# Patient Record
Sex: Female | Born: 1990 | Race: Black or African American | Hispanic: No | Marital: Single | Smoking: Never smoker
Health system: Southern US, Community
[De-identification: ages and names within clinical notes are randomized; demographics above are authoritative.]

## PROBLEM LIST (undated history)

## (undated) HISTORY — PX: LEEP: SHX91

---

## 2017-09-26 NOTE — L&D Delivery Note (Signed)
Called to the room for delivery. Patient delivered infant on bed. Infant was small with fused eyelids. Heart beat was present with fetal movement of limbs. Umbilical cord was cut and clamped. infnat was wrapped in a warm blanket. Patient held infant in her arms for 10 minutes. Placenta delivered intact and spontaneously. There was evidence of a old clot attached to the placenta, suggestive of possible placental abruption. Marginal cord insertion. Placenta intact.  No vaginal lacerations.  Fundus firm and below the umbilicus.   Adelene Idler MD [facility], [facility] 02/17/18 8:13 PM

## 2018-02-17 ENCOUNTER — Inpatient Hospital Stay: Payer: Medicaid Other | Admitting: Anesthesiology

## 2018-02-17 ENCOUNTER — Emergency Department: Payer: Medicaid Other

## 2018-02-17 ENCOUNTER — Encounter: Payer: Self-pay | Admitting: Emergency Medicine

## 2018-02-17 ENCOUNTER — Other Ambulatory Visit: Payer: Self-pay

## 2018-02-17 ENCOUNTER — Inpatient Hospital Stay
Admission: EM | Admit: 2018-02-17 | Discharge: 2018-02-18 | DRG: 805 | Disposition: A | Discharge status: Home / Self Care | Payer: Medicaid Other | Attending: Obstetrics and Gynecology | Admitting: Obstetrics and Gynecology

## 2018-02-17 DIAGNOSIS — Z3A19 19 weeks gestation of pregnancy: Secondary | ICD-10-CM

## 2018-02-17 DIAGNOSIS — R109 Unspecified abdominal pain: Secondary | ICD-10-CM | POA: Diagnosis present

## 2018-02-17 DIAGNOSIS — O26872 Cervical shortening, second trimester: Principal | ICD-10-CM | POA: Diagnosis present

## 2018-02-17 DIAGNOSIS — O43122 Velamentous insertion of umbilical cord, second trimester: Secondary | ICD-10-CM | POA: Diagnosis present

## 2018-02-17 DIAGNOSIS — O418X20 Other specified disorders of amniotic fluid and membranes, second trimester, not applicable or unspecified: Secondary | ICD-10-CM

## 2018-02-17 DIAGNOSIS — O039 Complete or unspecified spontaneous abortion without complication: Secondary | ICD-10-CM

## 2018-02-17 LAB — WET PREP, GENITAL
SPERM: NONE SEEN
TRICH WET PREP: NONE SEEN
Yeast Wet Prep HPF POC: NONE SEEN

## 2018-02-17 LAB — CHLAMYDIA/NGC RT PCR (ARMC ONLY)
CHLAMYDIA TR: NOT DETECTED
N GONORRHOEAE: NOT DETECTED

## 2018-02-17 LAB — LIPASE, BLOOD: LIPASE: 26 U/L (ref 11–51)

## 2018-02-17 LAB — CBC
HEMATOCRIT: 34.9 % — AB (ref 35.0–47.0)
HEMOGLOBIN: 11.8 g/dL — AB (ref 12.0–16.0)
MCH: 34 pg (ref 26.0–34.0)
MCHC: 33.7 g/dL (ref 32.0–36.0)
MCV: 101.1 fL — AB (ref 80.0–100.0)
Platelets: 285 10*3/uL (ref 150–440)
RBC: 3.45 MIL/uL — AB (ref 3.80–5.20)
RDW: 12.2 % (ref 11.5–14.5)
WBC: 19.8 10*3/uL — AB (ref 3.6–11.0)

## 2018-02-17 LAB — COMPREHENSIVE METABOLIC PANEL
ALT: 19 U/L (ref 14–54)
ANION GAP: 10 (ref 5–15)
AST: 26 U/L (ref 15–41)
Albumin: 3.5 g/dL (ref 3.5–5.0)
Alkaline Phosphatase: 72 U/L (ref 38–126)
BUN: 7 mg/dL (ref 6–20)
CHLORIDE: 104 mmol/L (ref 101–111)
CO2: 20 mmol/L — ABNORMAL LOW (ref 22–32)
Calcium: 8.9 mg/dL (ref 8.9–10.3)
Creatinine, Ser: 0.61 mg/dL (ref 0.44–1.00)
Glucose, Bld: 117 mg/dL — ABNORMAL HIGH (ref 65–99)
POTASSIUM: 3.4 mmol/L — AB (ref 3.5–5.1)
SODIUM: 134 mmol/L — AB (ref 135–145)
Total Bilirubin: 0.8 mg/dL (ref 0.3–1.2)
Total Protein: 7.2 g/dL (ref 6.5–8.1)

## 2018-02-17 LAB — URINE DRUG SCREEN, QUALITATIVE (ARMC ONLY)
AMPHETAMINES, UR SCREEN: NOT DETECTED
BENZODIAZEPINE, UR SCRN: NOT DETECTED
Barbiturates, Ur Screen: NOT DETECTED
Cannabinoid 50 Ng, Ur ~~LOC~~: POSITIVE — AB
Cocaine Metabolite,Ur ~~LOC~~: NOT DETECTED
MDMA (ECSTASY) UR SCREEN: NOT DETECTED
METHADONE SCREEN, URINE: NOT DETECTED
Opiate, Ur Screen: NOT DETECTED
PHENCYCLIDINE (PCP) UR S: NOT DETECTED
Tricyclic, Ur Screen: NOT DETECTED

## 2018-02-17 LAB — HCG, QUANTITATIVE, PREGNANCY: hCG, Beta Chain, Quant, S: 9075 m[IU]/mL — ABNORMAL HIGH (ref <5)

## 2018-02-17 MED ORDER — IBUPROFEN 600 MG PO TABS
600.0000 mg | ORAL_TABLET | Freq: Four times a day (QID) | ORAL | Status: DC | PRN
  Administered 2018-02-17: 600 mg via ORAL
  Filled 2018-02-17: qty 1

## 2018-02-17 MED ORDER — MORPHINE SULFATE (PF) 4 MG/ML IV SOLN
INTRAVENOUS | Status: AC
  Administered 2018-02-17: 4 mg via INTRAVENOUS
  Filled 2018-02-17: qty 1

## 2018-02-17 MED ORDER — IBUPROFEN 800 MG PO TABS: 800.0000 mg | ORAL_TABLET | Freq: Three times a day (TID) | ORAL | 1 refills | Status: DC | PRN

## 2018-02-17 MED ORDER — LACTATED RINGERS IV SOLN
500.0000 mL | INTRAVENOUS | Status: DC | PRN
  Administered 2018-02-17: 1000 mL via INTRAVENOUS

## 2018-02-17 MED ORDER — ONDANSETRON HCL 4 MG/2ML IJ SOLN: 4.0000 mg | Freq: Four times a day (QID) | INTRAMUSCULAR | Status: DC | PRN

## 2018-02-17 MED ORDER — LIDOCAINE HCL (PF) 1 % IJ SOLN: 30.0000 mL | INTRAMUSCULAR | Status: DC | PRN

## 2018-02-17 MED ORDER — OXYTOCIN BOLUS FROM INFUSION
500.0000 mL | Freq: Once | INTRAVENOUS | Status: AC
  Administered 2018-02-17: 500 mL via INTRAVENOUS

## 2018-02-17 MED ORDER — EPHEDRINE 5 MG/ML INJ: 10.0000 mg | INTRAVENOUS | Status: DC | PRN

## 2018-02-17 MED ORDER — BUPIVACAINE HCL (PF) 0.25 % IJ SOLN
INTRAMUSCULAR | Status: DC | PRN
  Administered 2018-02-17: 10 mL via EPIDURAL

## 2018-02-17 MED ORDER — AMMONIA AROMATIC IN INHA
RESPIRATORY_TRACT | Status: AC
  Filled 2018-02-17: qty 10

## 2018-02-17 MED ORDER — LACTATED RINGERS IV SOLN
INTRAVENOUS | Status: DC
  Administered 2018-02-17: 18:00:00 via INTRAVENOUS

## 2018-02-17 MED ORDER — SOD CITRATE-CITRIC ACID 500-334 MG/5ML PO SOLN: 30.0000 mL | ORAL | Status: DC | PRN

## 2018-02-17 MED ORDER — LACTATED RINGERS IV SOLN: 500.0000 mL | Freq: Once | INTRAVENOUS | Status: DC

## 2018-02-17 MED ORDER — HYDROMORPHONE HCL 1 MG/ML IJ SOLN
INTRAMUSCULAR | Status: AC
  Administered 2018-02-17: 1 mg via INTRAVENOUS
  Filled 2018-02-17: qty 1

## 2018-02-17 MED ORDER — LIDOCAINE-EPINEPHRINE (PF) 1.5 %-1:200000 IJ SOLN
INTRAMUSCULAR | Status: DC | PRN
  Administered 2018-02-17: 3 mL via PERINEURAL

## 2018-02-17 MED ORDER — FENTANYL 2.5 MCG/ML W/ROPIVACAINE 0.15% IN NS 100 ML EPIDURAL (ARMC)
EPIDURAL | Status: AC
  Filled 2018-02-17: qty 100

## 2018-02-17 MED ORDER — MORPHINE SULFATE (PF) 4 MG/ML IV SOLN
4.0000 mg | Freq: Once | INTRAVENOUS | Status: AC
  Administered 2018-02-17: 4 mg via INTRAVENOUS

## 2018-02-17 MED ORDER — PHENYLEPHRINE 40 MCG/ML (10ML) SYRINGE FOR IV PUSH (FOR BLOOD PRESSURE SUPPORT): 80.0000 ug | PREFILLED_SYRINGE | INTRAVENOUS | Status: DC | PRN

## 2018-02-17 MED ORDER — DIPHENHYDRAMINE HCL 50 MG/ML IJ SOLN: 12.5000 mg | INTRAMUSCULAR | Status: DC | PRN

## 2018-02-17 MED ORDER — HYDROMORPHONE HCL 1 MG/ML IJ SOLN
1.0000 mg | Freq: Once | INTRAMUSCULAR | Status: AC
  Administered 2018-02-17: 1 mg via INTRAVENOUS

## 2018-02-17 MED ORDER — SODIUM CHLORIDE 0.9 % IV BOLUS
1000.0000 mL | Freq: Once | INTRAVENOUS | Status: AC
  Administered 2018-02-17: 1000 mL via INTRAVENOUS

## 2018-02-17 MED ORDER — OXYTOCIN 10 UNIT/ML IJ SOLN
INTRAMUSCULAR | Status: AC
  Filled 2018-02-17: qty 2

## 2018-02-17 MED ORDER — OXYTOCIN 40 UNITS IN LACTATED RINGERS INFUSION - SIMPLE MED
INTRAVENOUS | Status: AC
  Administered 2018-02-17: 500 mL via INTRAVENOUS
  Filled 2018-02-17: qty 1000

## 2018-02-17 MED ORDER — OXYTOCIN 40 UNITS IN LACTATED RINGERS INFUSION - SIMPLE MED: 2.5000 [IU]/h | INTRAVENOUS | Status: DC

## 2018-02-17 MED ORDER — MISOPROSTOL 200 MCG PO TABS
ORAL_TABLET | ORAL | Status: AC
  Filled 2018-02-17: qty 4

## 2018-02-17 MED ORDER — LIDOCAINE HCL (PF) 1 % IJ SOLN
INTRAMUSCULAR | Status: DC | PRN
  Administered 2018-02-17: 3 mL

## 2018-02-17 MED ORDER — LIDOCAINE HCL (PF) 1 % IJ SOLN
INTRAMUSCULAR | Status: AC
  Filled 2018-02-17: qty 30

## 2018-02-17 MED ORDER — PROMETHAZINE HCL 25 MG/ML IJ SOLN
12.5000 mg | Freq: Once | INTRAMUSCULAR | Status: AC
  Administered 2018-02-17: 12.5 mg via INTRAVENOUS

## 2018-02-17 MED ORDER — BENZOCAINE-MENTHOL 20-0.5 % EX AERO
1.0000 "application " | INHALATION_SPRAY | Freq: Four times a day (QID) | CUTANEOUS | Status: DC | PRN
  Administered 2018-02-17: 1 via TOPICAL
  Filled 2018-02-17: qty 56

## 2018-02-17 MED ORDER — FENTANYL 2.5 MCG/ML W/ROPIVACAINE 0.15% IN NS 100 ML EPIDURAL (ARMC)
12.0000 mL/h | EPIDURAL | Status: DC
  Administered 2018-02-17: 12 mL/h via EPIDURAL

## 2018-02-17 NOTE — ED Notes (Signed)
Pt rolling around in bed stating she is in pain. EDP at bedside. PT was dozing in and out of sleep after Nausea meds given. Pt is awaiting [place] at this time.

## 2018-02-17 NOTE — Progress Notes (Signed)
Chaplain responded to an OR  for spiritual care for the patient. Upon arrival, the patient's nurses said that now was not a good time, as the patient did not want to speak with anyone. Chaplain left the on-call pager number and offered to return whenever the patient or staff desired pastoral care.

## 2018-02-17 NOTE — Discharge Instructions (Signed)
 Please accept our heartfelt condolences and call us if you need an ear to listen.  Resources: www.babycenter.com www.acog.org [email or web address] www.[email or web address] www.[email or web address] This site has an excellent pamphlet that you can download on early pregnancy loss.  Miscarriage: Women Sharing from the Heart by Marie Allen and Shelly Marks. Published by [employer]. Summarizes reactions of 100 women who experience miscarriage. Molly's [place] by Janice Cohn. Published by [employer]. Children's book on miscarriage. Miscarriage: A Man's Book by Rick Wheat. Published by [employer] at [phone]. Written to help men understand reactions of both parents to miscarriage. Miscarriage: A Shattered Dream by Sherokee Ilse and Linda Hammer Burns. I Never Held You: A book about miscarriage, healing and recovery by Ellen M. DuBois.   MISCARRIAGE OR NEONATAL LOSS.a word to parents By Gretchen Gross, MSW For those who experience a miscarriage or fetal loss, the world has turned upside down. Regardless of whether the pregnancy was planned and wanted, or unplanned and the parents were ambivalent, there are feelings of sadness, grief and a loss of equilibrium which effects both parents. Though we allow for these feelings when other deaths occur, we are remiss in acknowledging and allowing parents to grieve their pregnancy loss or miscarriage. We wrongly equate the depth of the loss to the size of the casket. With pregnancy loss or death before birth, there is little guidance or expectation of what is normal grief. Others, who minimize or misunderstand the loss, may expect that you "get back to normal" quickly. With pressures to overlook the impact of the loss, healthy grieving processes can get stunted, overlooked, or stopped all together. I hope that this handout will allow you to acknowledge and express whatever loss you feel, to share with others  your sorrow and will encourage you and others to take a moment to understand and experience the depth of your emotions at this time.  A baby represents so much. It may represent hope, immortality, fulfillment of dreams, or an outward sign of a loving relationship. At the same time, pregnancy may bring on anxiety, fear, and an increase in commitment and responsibility. Understandably, few pregnant parents experience only one or the other of these emotions. Despite the type of emotional response to the pregnancy, as the process continues, what is central to almost all pregnancies is that over time, the bond between parent and child grows. Each day brings an expansion of the world. What was once routine now becomes more important. Diet, lifestyle, connection to family, time commitments, relationships with friends, spouse, others.nothing is as it was. These are the normal changes that a pregnant woman and her partner experience. When a baby dies, nothing that made sense before makes any sense after. We are left emotionally raw. Often our bodies respond in ways that assure us that we are crazy, but we are not. We are grieving a life that we never fully knew, a relationship that was too short. A part of us has died too. Mothers and fathers become more attached to a pregnancy as each day, week and month go by. Mothers have physical contact with the pregnancy, daily reminders of the growth and change occurring. They connect by talking to their bellies, by wondering what life will be like this time next year, look for clothes, cribs, pediatricians and more. Fathers often become attached in different ways: considering buying a new car or safe car seat, working more to earn a little more money before the baby arrives, or by reconsidering the family's financial   status. However bonding occurs, it generally increases as the baby grows. For family members, co-workers and friends, the pregnancy may be less  real. They may only know of the pregnancy from a physical or emotional distance. Since many of us are so uncomfortable with death and we work hard to deny it. With a prenatal loss, the lack of contact with the unborn baby may encourage others to minimize the loss, or even suggest that it was something to feel thankful for at this time suggesting that the pain felt now would be significantly less than at any other time in relationship to this child. That is why it feels so crazy to be in such pain when others might not understand. You might feel that you are still standing at an emotional graveside and the world wonders "when you'll be ready to go back to work" or "why you still cry. It's been four months already." Be assured that it is the rest of the world that is, in this case, acting crazy. When a living child or adult dies, we have understandings, behaviors, rituals that though painful, enable us to feel cared for, comforted, acknowledged in our loss, and encourage healing. Unfortunately, too little of this is the case when there is a miscarriage or neonatal loss. There are many difficult dilemmas that you might face in the wake of the loss. Do you have a funeral? How do you tell people? Do you publish an obituary? What do you do with the remains, both physical and emotional? I encourage people to find a formalized way to acknowledge the loss of the pregnancy and to honor the relationship that did and does still exist and will for the rest of your life. It is your choice as to whether this happens in a funeral service, in a memorial service, in a letter to the child who you did not meet or in some manner that makes sense to you religiously, spiritually or emotionally. It is not silly to ask friends or family to participate nor is it improper to keep this intensely personal and private. Grief, however, needs to be a public process. We must grieve among others and with our loved ones. We do  not all need to feel the same amount of pain in the loss, but we need to act as a loving community to support those most hurt at this time. Healthy grieving involves a strengthening of the bonds with others who we love and who love us. Some people ask a close friend or relative to contact others and inform them of the loss to minimize the telling of the story. Others find relief in telling friends, family and colleagues about the loss, in that it reminds us that the pregnancy was real and that this pain is real. Where one connection is broken, another is reinforced and strengthened. Grieving in isolation can be detrimental. By inviting important others to a funeral, tree planting, or sunset service and by taking them up on an offer to help will help your healing. Some people have described aspects of grief after a pregnancy loss that are quite different from those present after the loss of an adult relative or friend. Some things that might seem "weird" or "abnormal" are indeed, quite normal following this kind of loss. G.W. Davidson described her experience in the book "Understanding: Death of the Wished-for Child." "I kept searching for something. I wasn't sure what it was. All of a sudden one day in the kitchen, I spontaneously   got out my kitchen scales and started weighing fruits and vegetables. I realized I was trying to find something that had the identical weight that the baby did.I found myself weighing my rolling pin.it happened to be the identical length and weight that the baby was." This describes the process of building memories. With miscarriage and neonatal loss, there are too few memories of the pregnancy or the baby itself. Parents may not ever be able to see or hold a baby in these cases, especially in the earlier trimesters, so saving mementos and gathering and providing information are especially important. G.W. Davidson described a parent who needs to see and feel  something that had similar dimensions as the child. This is profoundly different than the grieving that happens when a 27 year old person dies, when we have years of memories, concrete possessions, photos and history. Though it is a different aspect of grief, it is normal, expected and healthy. Your grief will not make you crazy. Also, do not be frightened if you feel this loss in a very physical manner. Your arms or chest may ache, convincing you that your heart has broken. Women have described this as a feeling of "empty arms". They also describe a similar feeling of an emptiness in their bellies, which was previously full. Some women also swear that they still feel the baby moving in their bellies. Many people report being woken to the sound of a crying baby, having vivid dreams about babies, trying to find their lost babies, or graphic dreams of injuries happening to themselves or others. Breasts may still feel as if they are filling and it may seem as if the body is unaware of the loss. These things can feel quite confusing. If you experience any of these responses, please tell someone about them. They are not a sign of insanity, they are aspects of grief. Keeping them to yourself increases the feelings of isolation and disequilibrium. Be reassured that these are normal experiences after the loss of a pregnancy. A WORD ABOUT DIFFERENCES BETWEEN MEN'S AND WOMEN'S GRIEF If most people do not fully understand the pain of a mother's grief through pregnancy loss, there is an even greater denial on society's part that the father is in pain. I have seen men in great pain and despair after the loss of a pregnancy and they say that nobody has ever asked them about their loss. People may only ask about their wife/partner's pain. To put it simply, one father, head bowed, shoulders heaving as he cried said "if one more person asks me how Jane is, I'll fly apart. Don't they know I lost a child too?  Don't they see the circles under my eyes, my face, my pain? Don't I count?" Couples often move through the grief process in different ways and at different times. Do not assume that your partner is better because they are wanting to take in a movie or go out to eat. This may just be a diversion, a need for a change of scenery, and a yearning for normalcy. Women are usually more comfortable crying and men find safety in action. For this reason, friction can develop between partners, when one wants the other to start behaving like they did before the loss or to cry along with them. Respect one another's process. Talk to your partner about the specifics. If asked, "How are you today?" many people will just say "fine". Specifically relate that you have had a hard time with the holiday,   a friends new baby, and ask your partner how they felt in that moment. Sex is very often a very difficult prospect after a miscarriage or loss. It might remind a person of the conception, raise fears of another pregnancy and future children, or seem like too much pleasure to experience when one is otherwise in pain. Many men reconnect with their partner by being sexual and feel increasingly isolated when a partner does not respond. Talk. There may be a comfortable middle ground with each other. If the general patterns of the relationship shift toward uncomfortable dynamics, such as decreased communication, blaming one another, or increased absences from the home, it is important to seek counseling with a professional. This is not an easy time, you have had a loss. Be patient with yourself. Seek help and support from loved ones, friends, professionals. The grief will evolve with time and you will not always be in such immediate or raw pain. Be assured that you may never forget your pregnancy or your baby. Healing takes time. I have compiled a list of books on grief and death that might be useful. *Anna:  A Daughters Life. Trinna Post, [employer], 1610. A fathers account of his grief at the loss of his infant daughter. *Explaining Death to Children. Freddi Che, [address], 9604. A valuable guide to help adults/parents explain death to children. *When Pregnancy Fails: Families Coping with Miscarriage, Ectopic Pregnancy, Stillbirth, and Infant Death. Jodie Echevaria and Marijean Heath, [employer], 5409. A good resource to understand pregnancy loss, including some information on support for the family. *Hour of Gold, Hour of Lead: Diaries and Letters of Gaspar Cola. [address] [place], [address]19. Cheri Fowler writes of the pain and loss in the wake of their young childs abduction. *A Child Dies: A Portrait of Family Grief. Clydene Laming and Penelope [address] [place], the [place], 1478. Dealing with death of unborn children, infants, and children of all ages. Revised by Davonna Belling. Lennox Pippins, MS November, 2008  COPING WITH A MISCARRIAGE Newman Nip, R.N., M.A.T. You have had a miscarriage. You have lost a pregnancy. This is undoubtedly a difficult and sad time for you. Your loss may be hard to believe and it is frustrating to know you are helpless to change anything. This cant possibly be happening to me. Why me? Why not someone else I promise Ill be more careful if I can only have my baby back. These are common reactions to experiencing a miscarriage. Feelings of disbelief and anger, helplessness and guilt, depression and perhaps failure, are real and understandable. You may tell yourself that the guilt you feel is irrational, unreasonable or inappropriate, but it is there. What did I do to cause this? Why wasnt I more careful? If only I had known Is this a punishment for something I did? You may find yourself recounting the days or weeks before your miscarriage, searching for clues that you feel sure you must have missed; searching for  valid reasons for how or why this happened. Having something taken away that you cherish feels shocking and unbelievable. You ask your doctor or midwife for an explanation; friends volunteer their opinions. In many cases, your questions of how or why are not satisfactorily answered. You may have some of the above thoughts or feelings and they may be present in different combinations, differing degrees, and in some confusion. They are understandable and part of the process of beginning to cope with a significant loss. With any death, it  death, it is healthy and essential to allow yourself to experience grief whether you are a man or woman. The grieving process is not something begun and completed in a day, a week or a month. It takes time, understanding, support and acceptance. In the case of a miscarriage, this process is often more difficult because the death is not publicly acknowledged. There is no funeral to arrange and attend, no outward recognition of a loss. This may tend to increase feelings of isolation and loneliness. The term miscarriage is used here instead of the more medically correct term spontaneous abortion to avoid confusion with elective abortion. Some couples feel they must keep their grief invisible and "get over this quickly." Most employers will freely give time off to attend a family funeral, but many do not understand the reasonable and justified request for time off after a miscarriage. Loss of a longed-for pregnancy, regardless of how early, is a significant bereavement. Men and women may react to the miscarriage differently. Men may feel more helpless and frustrated than their wives because they often assume a passive-observer role. Waiting and watching can be harder than actively participating, regardless of the outcome. Men do experience a miscarriage in spite of not actively participating in the physical loss. People have different ways of coping with a loss and you  need to choose what is most helpful to you. Too often "being strong" is looked upon as a healthy way of coping, when, in fact, repressing and ignoring very real feelings is not helpful and contributes to serious coping problems. Some feelings and questions that men and women express and ask are:  It hangs over me like a black cloud. Though I've tried, I honestly don't know how to work this through. How do I say goodbye?  I feel extremely guilty. I guess I really didn't want this pregnancy. I feel relieved that it's over, but so guilty because I'm relieved. This isn't right.  I'm fine. Life must go on and I'm a strong person. There's really nothing to grieve for anyway, is there?  I feel pressure not to be sad. So I tend to cover up a lot.  How do I respond to well meaning but frustrating platitudes like, "Try again soon, dear. Another pregnancy will help." I would like to be pregnant again, but I believe a pregnancy should happen at a time of strength, not sadness.  How do I respond to silence and awkward attempts to avoid the whole issue? (You may be the one that decides to bring up the topic. Friends, relatives and acquaintances often do not know how to respond, so they say nothing).  I'm fine until my period comes. It is such a start reminder of the part of me that is lost.  My husband/wife and I always enjoyed a good sex life. Since my miscarriage, I have a hard time allowing myself to feel loving and sensuous. After all if such a loving and pleasurable experience as intercourse started something that ended in such a tragedy, it's too scary to think it might happen again. Why doesn't my husband/wife understand?  I have to face reality. Should I just force myself to attend my friend's baby shower?  Everyone was so supportive and caring when I was in the hospital. That was four months ago. I can't burden others with my sadness now. Why am I still sad? I should be over this thing by  now. Knowing common facts about miscarriage probably will not blot   negative feelings - denial, anger, disappointment, sadness - but facts can hold some comfort and can help cushion the sadness and guilt. For those wanting a child, it is frustrating and intensely disappointing to experience any miscarriage. However, most pregnancies that end in the first three months are imperfect in some way and, regardless of any precaution or intervention, are incapable of surviving and growing beyond approximately 14 weeks. This fact may not ease your sense of emptiness and sadness, but it may help ease your guilt and sense of assumed responsibility. Knowing clearly that you did not cause this to happen, that you are not directly responsible of your miscarriage by what you did or did not do, can bring a sense of relief and relaxation to your anxieties or guilt. A pregnancy that ends in later months is uniquely difficult also. Your protruding abdomen, your childs movements and heartbeat, are concrete proof of your expectant parenthood. A miscarriage occurring at 6 weeks or at 6 months causes a sudden change of events and feelings. Understandably, you may feel robbed or cheated of a promise of what was to be. Its easy to think that everyone else can have children. Surely Lavenia Atlas been tricked or badly treated, you think. A fact not commonly know is that at least one in five pregnancies end in miscarriage, which indicates you are not alone. In a room of 25 couples, the odds are that 4 have experienced a miscarriage. Of these 8 people, many have shared your experience and feelings. Of course, this is not an easy topic to talk about, therefore it can appear that miscarriages rarely happen and that you are the only one coping with this loss. Realizing you are note alone can perhaps ease some of the shock and disbelief of WHY ME? Three actions will help: 1. Giver yourself permission to be sad. It is  possible to be sad without becoming chronically depressed. Set limits for yourself if you are concerned about becoming overly depressed. If the sadness begins to wash over you while at work at 2 p.m., tell yourself that you cannot deal with it then, but make an agreement with yourself that at 8 p.m. that night you will. And then do it. Many individuals report a beginning sense of control over their grief when they realize they can create appropriate and private times to be sad, alone, or with a significant other person. If you think you may be severely depressed (i.e. having difficulty getting out of bed in the morning, withdrawal from friends and relatives, insomnia, extended loss of appetite or overeating, loss of the ability to enjoy anything), this problem is probably not something you can handle yourself. It is important to seek out professional help at this time, or at any time when you are feeling particularly discouraged about your coping. 2. Find a supportive, trusted person and talk about your feelings and your questions. Build a support system, whether it be your spouse, partner, friend, relative, colleague, clergyman, support group or Pharmacist, hospital. You may still feel some pangs of sadness when attending a baby shower, celebrating a holiday, seeing other pregnant couples or passing the date of your expected delivery, but once you work through your loss, coping with your situation will become easier. 3. Also part of the process of life itself, is understanding and accepting incongruent or seemingly contradictory feelings. As one insightful women expressed, Being happy for a friend who has just had a baby (or twins!) is a genuine feeling, but also resentment,  also resentment, anger and frustration are there, all wrapped into one package;" two apparently conflicting feelings, but both can exist, be recognized, and accepted. A miscarriage is an unexpected, often unexplained, death; with any  loss you need to give yourself permission to grieve, to be angry, sad or relieved. There is no one appropriate reaction. Acknowledging your feelings that are real and understandable, though they may not be logical or rational, is part of the grieving process. Allowing the grieving process to happen is a positive way of coping and leads to health resolve and strength to look ahead. There may be times when you may not be able to put words to your feelings, but that does not need to stop you from seeking out and talking with an understanding person. Building your own support system will help you regain confidence and strength within yourself. RECOMMENDED READING The following materials are all available through [employer], [address], [place] [address]; phone [phone]. For Adults: Empty Arms by Sherokee Ilse Empty Cradle, Broken Heart by Deborah Davis Ended Beginnings by C. Panuthos & C. Romeo Miscarriage a [employer] Resource Miscarriage - A Shattered Dream by Sherokee Ilse Newborn Death a [employer] Resource Still To Be Born by Perinatal Loss When a Baby Dies by M.J. Church et al (TCF) When Hello Means Goodbye by P. Schwiebert and P. Kirk When Pregnancy Fails by S. Borg & J. Lasker For Helping Other Children How Do We Tell the Children? by Shaefer & Lyons No New Baby by Marilyn Gryte Talking About Death by Earl Grollman Where's Jess?? A [employer] Resource Other helpful materials available locally at bookstores: The Fall of Freddy the Leaf by Leo Buscaglia, PhD, [employer] (for children) When Goodbye is Forever - Learning to Live Again After the Loss of a Child by John Bramblett, [employer]   

## 2018-02-17 NOTE — ED Notes (Signed)
Pt states to OB-GYN MD that she had 2 previous pregnancy 1 is living and 1 is deceased at 6 mths 24 weeks with a c-section.

## 2018-02-17 NOTE — H&P (Signed)
History and Physical  Jenna Harvey is an 27 y.o. female.  HPI: Patient presented to the ER today complaining of severe abdominal pain which started around noon.Marland Kitchen She was taken to ultrasound, but could not tolerate the study. She was examined by the ER physician and found to have budgling membranes. She reports that she was being seen by [facility] this pregnancy. She has been followed for a short cervix and a hiistory of a preterm birth at [redacted] week gestation by a classical cesarean section. She has been receiving 17-P injections this pregnancy. She was supposed to get a cerclage next week.    OB History    Gravida  3   Para  2   Term  1   Preterm  1   AB      Living  1     SAB      TAB      Ectopic      Multiple      Live Births  2         November 24, 2014:  Hx of 1 prior classical cesarean delivery for a 24 week infant. Infant passed away at 6 months.   2008-11-24: Full term vaginal delivery   GYN History Patient denies any fibroids or ovarian cysts.  History reviewed. No pertinent past medical history.  Past Surgical History:  Procedure Laterality Date  . LEEP     unsure of the date, after 11/24/08     History reviewed. No pertinent family history.  Social History:  reports that she has never smoked. She has never used smokeless tobacco. She reports that she drank alcohol. She reports that she has current or past drug history.  Allergies: No Known Allergies  Medications: I have reviewed the patient's current medications.  Results for orders placed or performed during the hospital encounter of 02/17/18 (from the past 48 hour(s))  Lipase, blood     Status: None   Collection Time: 02/17/18  2:57 PM  Result Value Ref Range   Lipase 26 11 - 51 U/L    Comment: Performed at [facility], [address] [place], [place] [address]  Comprehensive metabolic panel     Status: Abnormal   Collection Time: 02/17/18  2:57 PM  Result Value Ref Range   Sodium 134 (L) 135 -  145 mmol/L   Potassium 3.4 (L) 3.5 - 5.1 mmol/L   Chloride 104 101 - 111 mmol/L   CO2 20 (L) 22 - 32 mmol/L   Glucose, Bld 117 (H) 65 - 99 mg/dL   BUN 7 6 - 20 mg/dL   Creatinine, Ser 0.61 0.44 - 1.00 mg/dL   Calcium 8.9 8.9 - 10.3 mg/dL   Total Protein 7.2 6.5 - 8.1 g/dL   Albumin 3.5 3.5 - 5.0 g/dL   AST 26 15 - 41 U/L   ALT 19 14 - 54 U/L   Alkaline Phosphatase 72 38 - 126 U/L   Total Bilirubin 0.8 0.3 - 1.2 mg/dL   GFR calc non Af Amer >60 >60 mL/min   GFR calc Af Amer >60 >60 mL/min    Comment: (NOTE) The eGFR has been calculated using the CKD EPI equation. This calculation has not been validated in all clinical situations. eGFR's persistently <60 mL/min signify possible Chronic Kidney Disease.    Anion gap 10 5 - 15    Comment: Performed at [facility], [address] [place], [place] [address]  CBC     Status: Abnormal  Collection Time: 02/17/18  2:57 PM  Result Value Ref Range   WBC 19.8 (H) 3.6 - 11.0 K/uL   RBC 3.45 (L) 3.80 - 5.20 MIL/uL   Hemoglobin 11.8 (L) 12.0 - 16.0 g/dL   HCT 34.9 (L) 35.0 - 47.0 %   MCV 101.1 (H) 80.0 - 100.0 fL   MCH 34.0 26.0 - 34.0 pg   MCHC 33.7 32.0 - 36.0 g/dL   RDW 12.2 11.5 - 14.5 %   Platelets 285 150 - 440 K/uL    Comment: Performed at [facility], [address] [place], [place] [address]  hCG, quantitative, pregnancy     Status: Abnormal   Collection Time: 02/17/18  2:57 PM  Result Value Ref Range   hCG, Beta Chain, Quant, S 9,075 (H) <5 mIU/mL    Comment:          GEST. AGE      CONC.  (mIU/mL)   <=1 WEEK        5 - 50     2 WEEKS       50 - 500     3 WEEKS       100 - 10,000     4 WEEKS     1,000 - 30,000     5 WEEKS     3,500 - 115,000   6-8 WEEKS     12,000 - 270,000    12 WEEKS     15,000 - 220,000        FEMALE AND NON-PREGNANT FEMALE:     LESS THAN 5 mIU/mL Performed at [facility], [address] [place], [place] [address]   Wet prep, genital     Status: Abnormal    Collection Time: 02/17/18  4:41 PM  Result Value Ref Range   Yeast Wet Prep HPF POC NONE SEEN NONE SEEN   Trich, Wet Prep NONE SEEN NONE SEEN   Clue Cells Wet Prep HPF POC PRESENT (A) NONE SEEN   WBC, Wet Prep HPF POC RARE (A) NONE SEEN    Comment: Specimen diluted due to transport tube containing more than 1 ml of saline, interpret results with caution.   Sperm NONE SEEN     Comment: Performed at [facility], [address] [place], [place] [address]    No results found.  Review of Systems  Constitutional: Negative for chills, fever, malaise/fatigue and weight loss.  HENT: Negative for congestion, hearing loss and sinus pain.   Eyes: Negative for blurred vision and double vision.  Respiratory: Negative for cough, sputum production, shortness of breath and wheezing.   Cardiovascular: Negative for chest pain, palpitations, orthopnea and leg swelling.  Gastrointestinal: Positive for abdominal pain. Negative for constipation, diarrhea, nausea and vomiting.  Genitourinary: Negative for dysuria, flank pain, frequency, hematuria and urgency.  Musculoskeletal: Negative for back pain, falls and joint pain.  Skin: Negative for itching and rash.  Neurological: Negative for dizziness and headaches.  Psychiatric/Behavioral: Negative for depression, substance abuse and suicidal ideas. The patient is not nervous/anxious.    Blood pressure 124/68, pulse (!) 102, temperature 98.2 F (36.8 C), temperature source Oral, resp. rate 16, SpO2 100 %. Physical Exam  Nursing note and vitals reviewed. Constitutional: She is oriented to person, place, and time. She appears well-developed and well-nourished.  HENT:  Head: Normocephalic and atraumatic.  Cardiovascular: Normal rate and regular rhythm.  Respiratory: Effort normal and breath sounds normal.  GI: Soft. Bowel sounds are normal.  Genitourinary:  Genitourinary Comments: Speculum exam showed membranes at the cervical os, appearance of  fetal head in membranes.  Gentle digital exam showed patient to be 3 cm dilated with membranes pass the cervical os.   Musculoskeletal: Normal range of motion.  Neurological: She is alert and oriented to person, place, and time.  Skin: Skin is warm and dry.  Psychiatric: She has a normal mood and affect. Her behavior is normal. Judgment and thought content normal.   Bedside US: Shows cephalic fetus, fetal heart rate present 160 bpm,  Femur length is 19 weeks 3 days.   Assessment/Plan: 27 yo G3P1101 at 19 weeks 3 days gestation with second trimester abortion in process 1. Will admit patient to labor and delivery for comfort and supportive care. Epidural when desired. Delivery expected.   Wash Nienhaus R Dajuan Turnley 02/17/2018, 6:04 PM

## 2018-02-17 NOTE — Anesthesia Procedure Notes (Signed)
Epidural Patient location during procedure: OB Start time: 02/17/2018 6:05 PM End time: 02/17/2018 6:11 PM  Staffing Anesthesiologist: Yves Dill, MD Performed: anesthesiologist   Preanesthetic Checklist Completed: patient identified, site marked, surgical consent, pre-op evaluation, timeout performed, IV checked, risks and benefits discussed and monitors and equipment checked  Epidural Patient position: sitting Prep: Betadine Patient monitoring: heart rate, continuous pulse ox and blood pressure Approach: midline Location: L3-L4 Injection technique: LOR air  Needle:  Needle type: Tuohy  Needle gauge: 17 G Needle length: 9 cm and 9 Catheter type: closed end flexible Catheter size: 19 Gauge Test dose: negative and 1.5% lidocaine with Epi 1:200 K  Assessment Events: blood not aspirated, injection not painful, no injection resistance, negative IV test and no paresthesia  Additional Notes Time out called.  Patient placed in sitting position.  Back prepped and draped in sterile fashion.  A skin wheal was made in the L3-L4 interspace with 1% Lidocaine plain.  A 17G Tuohy needle was advanced to the epidural space by a loss of resistance technique.  The epidural was threaded 3 cm and the TD was negative.  The patient tolerated the procedure well. The catheter was affixed to the back in sterile fashion.Reason for block:procedure for pain

## 2018-02-17 NOTE — Anesthesia Preprocedure Evaluation (Signed)
Anesthesia Evaluation  Patient identified by MRN, date of birth, ID band Patient awake    Reviewed: Allergy & Precautions, NPO status , Patient's Chart, lab work & pertinent test results  Airway Mallampati: II  TM Distance: >3 FB     Dental   Pulmonary neg pulmonary ROS,    Pulmonary exam normal        Cardiovascular negative cardio ROS Normal cardiovascular exam     Neuro/Psych negative neurological ROS  negative psych ROS   GI/Hepatic negative GI ROS, Neg liver ROS,   Endo/Other  negative endocrine ROS  Renal/GU negative Renal ROS  negative genitourinary   Musculoskeletal negative musculoskeletal ROS (+)   Abdominal Normal abdominal exam  (+)   Peds negative pediatric ROS (+)  Hematology negative hematology ROS (+)   Anesthesia Other Findings   Reproductive/Obstetrics (+) Pregnancy                             Anesthesia Physical Anesthesia Plan  ASA: II  Anesthesia Plan: Epidural   Post-op Pain Management:    Induction:   PONV Risk Score and Plan:   Airway Management Planned: Natural Airway  Additional Equipment:   Intra-op Plan:   Post-operative Plan:   Informed Consent: I have reviewed the patients History and Physical, chart, labs and discussed the procedure including the risks, benefits and alternatives for the proposed anesthesia with the patient or authorized representative who has indicated his/her understanding and acceptance.   Dental advisory given  Plan Discussed with: CRNA and Surgeon  Anesthesia Plan Comments:        Anesthesia Quick Evaluation  

## 2018-02-17 NOTE — ED Notes (Signed)
EDP placed MRI on hold until OBGYN MD arrives.

## 2018-02-17 NOTE — Progress Notes (Signed)
Patient ID: Jenna Harvey, female   DOB: 07/03/1991, 27 y.o.   MRN: [id number]  Spoke with patient in the room. She is feeling as well as expected after the birth of the infant. She would like to be discharged home this evening. She has named her infant hope Drue Second. She declined the kleinhauer- betke testing for fetal abruption, she is tired from having pokes for blood drawls. We discussed that with her next pregnancy she should have a cerclage placed at 12 weeks. We also discussed that she can receive free grief counseling through hospice. Will discharge patient home this evening.   Adelene Idler MD [facility], [facility] 02/17/18 9:39 PM

## 2018-02-17 NOTE — Discharge Summary (Signed)
OB Discharge Summary     Patient Name: Jenna Harvey DOB: 07/02/1991 MRN: [id number]  Date of admission: 02/17/2018 Delivering MD: Natale Milch, MD  Date of Delivery: 02/17/2018  Date of discharge: 02/17/2018  Admitting diagnosis: Abdominal Pain- [redacted] weeks pregnant Intrauterine pregnancy: [redacted]w[redacted]d     Secondary diagnosis: late second trimester spontaneous abortion     Discharge diagnosis: Late second trimester spontaneous abortion,                           Hospital course:  Onset of Labor With Vaginal Delivery     27 y.o. yo G3P1101 at [redacted]w[redacted]d was admitted in Active Labor on 02/17/2018. Patient had an uncomplicated labor course as follows:  Membrane Rupture Time/Date: 7:25 PM ,02/17/2018   Intrapartum Procedures: Episiotomy: None [1]                                         Lacerations:  None [1]  Patient had a delivery of a Viable infant. 02/17/2018  Information for the patient's newborn:  Vernia, Teem [[id number]]  Delivery Method: Vaginal, Spontaneous(Filed from Delivery Summary)    Pateint had an uncomplicated postpartum course.  She is ambulating, tolerating a regular diet, passing flatus, and urinating well. Patient is discharged home in stable condition on 02/17/18.                                                                  Post partum procedures:none  Complications: None  Physical exam on 02/17/2018: Vitals:   02/17/18 1915 02/17/18 1920 02/17/18 1930 02/17/18 1957  BP:    (!) 116/54  Pulse:    (!) 106  Resp:      Temp:      TempSrc:      SpO2: 100% 100% 100%   Weight:      Height:       General: alert Lochia: appropriate Uterine Fundus: firm Incision: N/A DVT Evaluation: No evidence of DVT seen on physical exam.  Labs: Lab Results  Component Value Date   WBC 19.8 (H) 02/17/2018   HGB 11.8 (L) 02/17/2018   HCT 34.9 (L) 02/17/2018   MCV 101.1 (H) 02/17/2018   PLT 285 02/17/2018   CMP Latest Ref Rng & Units 02/17/2018  Glucose 65 - 99  mg/dL 782(N)  BUN 6 - 20 mg/dL 7  Creatinine 5.62 - 1.30 mg/dL 8.65  Sodium 784 - 696 mmol/L 134(L)  Potassium 3.5 - 5.1 mmol/L 3.4(L)  Chloride 101 - 111 mmol/L 104  CO2 22 - 32 mmol/L 20(L)  Calcium 8.9 - 10.3 mg/dL 8.9  Total Protein 6.5 - 8.1 g/dL 7.2  Total Bilirubin 0.3 - 1.2 mg/dL 0.8  Alkaline Phos 38 - 126 U/L 72  AST 15 - 41 U/L 26  ALT 14 - 54 U/L 19    Discharge instruction: per After Visit Summary.  Medications:  Allergies as of 02/17/2018   No Known Allergies     Medication List    TAKE these medications   ibuprofen 800 MG tablet Commonly known as:  ADVIL,MOTRIN Take 1 tablet (800 mg total) by mouth every 8 (eight)  hours as needed for cramping.   multivitamin-prenatal 27-0.8 MG Tabs tablet Take 1 tablet by mouth daily at 12 noon.            Discharge Care Instructions  (From admission, onward)        Start     Ordered   02/17/18 0000  Discharge wound care:    Comments:  SHOWER DAILY Wash incision gently with soap and water.  Call office with any drainage, redness, or firmness of the incision.   02/17/18 2154      Diet: routine diet  Activity: Advance as tolerated. Pelvic rest for 6 weeks.   Outpatient follow up:     Postpartum contraception: Not Discussed Rhogam Given postpartum: no Rubella vaccine given postpartum: no Varicella vaccine given postpartum: no TDaP given antepartum or postpartum: No  Newborn Data: Live born child  Birth Weight: 10.6 oz (300 g) APGAR: ,   Newborn Delivery   Birth date/time:  02/17/2018 19:25:00 Delivery type:  Vaginal, Spontaneous      Disposition:morgue  SIGNED:  Natale Milch, MD 02/17/2018 9:54 PM

## 2018-02-17 NOTE — ED Provider Notes (Signed)
[facility] Emergency Department Provider Note  Time seen: 3:00 PM  I have reviewed the triage vital signs and the nursing notes.   HISTORY  Chief Complaint Abdominal Pain    HPI Jenna Harvey is a 27 y.o. female G3, P2 [redacted] weeks pregnant who presents to the emergency department for abdominal pain nausea vomiting.  According to the patient for the past 2 hours she has been expensing lower abdominal pain as well as nausea vomiting.  Patient states she had nausea vomiting earlier in the pregnancy but has not had any for several weeks.  Patient states she took Phenergan earlier on in her pregnancy as well, but has not taken any recently.  Patient states moderate abdominal pain across her entire abdomen but worse on the lower abdomen.  Denies any dysuria, hematuria, vaginal fluid discharge or bleeding.  Patient states she was feeling well up until several hours ago.   History reviewed. No pertinent past medical history.  There are no active problems to display for this patient.   History reviewed. No pertinent surgical history.  Prior to Admission medications   Not on File    No Known Allergies  No family history on file.  Social History Social History   Tobacco Use  . Smoking status: Never Smoker  . Smokeless tobacco: Never Used  Substance Use Topics  . Alcohol use: Not Currently  . Drug use: Not Currently    Review of Systems Constitutional: Negative for fever. Eyes: Negative for visual complaints ENT: Negative for recent illness/congestion Cardiovascular: Negative for chest pain. Respiratory: Negative for shortness of breath. Gastrointestinal: Positive for moderate lower abdominal pain, aching.  Positive for nausea vomiting.  Negative for diarrhea. Genitourinary: Negative for urinary complaints. Musculoskeletal: Negative for musculoskeletal complaints Skin: Negative for skin complaints  Neurological: Negative for headache All other ROS  negative  ____________________________________________   PHYSICAL EXAM:  VITAL SIGNS: ED Triage Vitals [02/17/18 1448]  Enc Vitals Group     BP (!) 112/49     Pulse Rate (!) 103     Resp 16     Temp 98.4 F (36.9 C)     Temp Source Oral     SpO2 100 %     Weight      Height      Head Circumference      Peak Flow      Pain Score 10     Pain Loc      Pain Edu?      Excl. in GC?     Constitutional: Alert and oriented.  No distress writhing around on the bed holding her lower abdomen, active vomiting. Eyes: Normal exam ENT   Head: Normocephalic and atraumatic.   Mouth/Throat: Mucous membranes are moist. Cardiovascular: Normal rate, regular rhythm. No murmur Respiratory: Normal respiratory effort without tachypnea nor retractions. Breath sounds are clear  Gastrointestinal: Soft, mild diffuse tenderness palpation, somewhat more so in the suprapubic region.  No rebound or guarding.  No distention. Musculoskeletal: Nontender with normal range of motion in all extremities.  Neurologic:  Normal speech and language. No gross focal neurologic deficits  Skin:  Skin is warm, dry and intact.  Psychiatric: Mood and affect are normal.     INITIAL IMPRESSION / ASSESSMENT AND PLAN / ED COURSE  Pertinent labs & imaging results that were available during my care of the patient were reviewed by me and considered in my medical decision making (see chart for details).  Patient presents to the  emergency department for nausea vomiting lower abdominal pain beginning approximately 1 to 2 hours ago.  Patient actively vomiting in the emergency department.  Differential is quite broad but would include gastroenteritis, gastritis, ulcerative disease, pancreatitis, urinary tract infection, gallbladder disease, pregnancy related issue.  We will check labs, IV hydrate, treat with IV Phenergan.  We will obtain an OB ultrasound to further evaluate.  Patient agreeable to this plan of care.  She  continues to be nauseated continues to complain of significant lower abdominal pain.  Attempted to have the ultrasound performed but the patient cannot hold still for the ultrasound, ultrasound tech had to abort the ultrasound attempt.  I discussed with the patient it is extremely important that we get an ultrasound done, will also need to perform a pelvic examination.  I discussed the risk and benefits of IV pain medication.  However as the patient states severe pain in the lower abdomen and cannot tolerate any procedure with this current amount of pain we will dose 4 mg of IV morphine, and continue to closely monitor.  Once the patient's pain is controlled I plan I will perform a pelvic examination and having the patient return to ultrasound for evaluation.   After pain medication patient is able to allow more adequate abdominal examination.  Patient has a completely benign abdomen besides her right lower quadrant with moderate tenderness to palpation.  Unfortunately given her pregnancy status with moderate lower quadrant tenderness and elevated white blood cell count to 19,000 and believe the patient will require an MRI to help rule out and evaluate for appendicitis.  Pelvic examination shows what appears to be amniotic sac extending through the vaginal cervix.  We will discuss with OB/GYN urgently.  OB has seen the patient, believe the patient is unfortunately suffering preterm labor/second trimester miscarriage.  I will cancel the MRI and ultrasound at this time.  Patient taken upstairs by OB for admission.   ____________________________________________   FINAL CLINICAL IMPRESSION(S) / ED DIAGNOSES  Abdominal pain Nausea vomiting    Minna Antis, MD 02/17/18 2024

## 2018-02-17 NOTE — ED Triage Notes (Signed)
Pt to ED via POV c/o severe abdominal pain and vomiting that started 1 hour PTA. Pt states that she is [redacted] weeks pregnant, pt is G3P2, no pregnancy complications. Pt states that she sees [facility] [facility] for her OB care.

## 2018-02-18 LAB — ABO/RH: ABO/RH(D): B POS

## 2018-02-18 NOTE — OB Triage Note (Signed)
Postpartum discharge instructions and teaching provided to pt per RN and Dr Jerene Pitch. Pt ambulating oob, walking without difficulty to restroom for void and performing pericare with minimal assistance. RN remain present. Explained signs and symptoms to monitor for and report if noticed. Discussed verbalizing feeling and seeking help and support from medical staff and family as needed. Pt agrees to continue monitoring vaginal bleeding and will return to hospital with any concerns. Advised pt to follow-up post delivery with OB provider next Wed as scheduled. Pt states her wishes , would like hospital cremation and then ashes returned to her. Forms and consents completed. Understanding with plan verbalized. Pt denies any needs or concerns with going home.

## 2018-02-18 NOTE — Progress Notes (Signed)
2130 - Dr Jerene Pitch at bedside speaking with pt and discussing pt request to be discharged home tonight as soon and she can possibly leave. Spoke with Dr Noralyn Pick about pt having Epidural and still some numbness in legs. Explained pt should remain for at least 3 hrs from time of delivery and monitors pt ambulating to ensure she is not weak and can ambulate without support. Pt aware of need for monitoring for proper ambulation and for postpartum bleeding. Says she would like to go home around 2300. Dr Jerene Pitch providing pt with okay for discharge, will provide pt with out of work not and will send rx for Ibuprofen to pharmacy of pt choice on file.

## 2018-02-18 NOTE — Anesthesia Postprocedure Evaluation (Signed)
Anesthesia Post Note  Patient: Jenna Harvey  Procedure(s) Performed: AN AD HOC LABOR EPIDURAL  Patient location during evaluation: L&D Anesthesia Type: Epidural Level of consciousness: awake and alert and oriented Pain management: pain level controlled Vital Signs Assessment: post-procedure vital signs reviewed and stable Respiratory status: spontaneous breathing Cardiovascular status: blood pressure returned to baseline Postop Assessment: no headache and no backache Anesthetic complications: no     Last Vitals: There were no vitals filed for this visit.  Last Pain: There were no vitals filed for this visit.               Keni Elison

## 2018-02-21 LAB — SURGICAL PATHOLOGY

## 2019-08-29 ENCOUNTER — Emergency Department
Admission: EM | Admit: 2019-08-29 | Discharge: 2019-08-30 | Disposition: A | Discharge status: Home / Self Care | Payer: Medicaid Other | Attending: Emergency Medicine | Admitting: Emergency Medicine

## 2019-08-29 ENCOUNTER — Emergency Department: Payer: Medicaid Other

## 2019-08-29 ENCOUNTER — Other Ambulatory Visit: Payer: Self-pay

## 2019-08-29 DIAGNOSIS — Z3A00 Weeks of gestation of pregnancy not specified: Secondary | ICD-10-CM | POA: Insufficient documentation

## 2019-08-29 DIAGNOSIS — O210 Mild hyperemesis gravidarum: Secondary | ICD-10-CM | POA: Insufficient documentation

## 2019-08-29 DIAGNOSIS — Z3491 Encounter for supervision of normal pregnancy, unspecified, first trimester: Secondary | ICD-10-CM

## 2019-08-29 DIAGNOSIS — R112 Nausea with vomiting, unspecified: Secondary | ICD-10-CM

## 2019-08-29 DIAGNOSIS — O3680X0 Pregnancy with inconclusive fetal viability, not applicable or unspecified: Secondary | ICD-10-CM

## 2019-08-29 DIAGNOSIS — O219 Vomiting of pregnancy, unspecified: Secondary | ICD-10-CM | POA: Diagnosis present

## 2019-08-29 LAB — LIPASE, BLOOD: Lipase: 31 U/L (ref 11–51)

## 2019-08-29 LAB — COMPREHENSIVE METABOLIC PANEL
ALT: 18 U/L (ref 0–44)
AST: 23 U/L (ref 15–41)
Albumin: 4.7 g/dL (ref 3.5–5.0)
Alkaline Phosphatase: 70 U/L (ref 38–126)
Anion gap: 12 (ref 5–15)
BUN: 14 mg/dL (ref 6–20)
CO2: 18 mmol/L — ABNORMAL LOW (ref 22–32)
Calcium: 9.6 mg/dL (ref 8.9–10.3)
Chloride: 106 mmol/L (ref 98–111)
Creatinine, Ser: 0.89 mg/dL (ref 0.44–1.00)
GFR calc Af Amer: >60 mL/min (ref >60)
GFR calc non Af Amer: >60 mL/min (ref >60)
Glucose, Bld: 161 mg/dL — ABNORMAL HIGH (ref 70–99)
Potassium: 4.1 mmol/L (ref 3.5–5.1)
Sodium: 136 mmol/L (ref 135–145)
Total Bilirubin: 0.6 mg/dL (ref 0.3–1.2)
Total Protein: 8.2 g/dL — ABNORMAL HIGH (ref 6.5–8.1)

## 2019-08-29 LAB — HCG, QUANTITATIVE, PREGNANCY: hCG, Beta Chain, Quant, S: 186 m[IU]/mL — ABNORMAL HIGH (ref <5)

## 2019-08-29 LAB — URINALYSIS, COMPLETE (UACMP) WITH MICROSCOPIC
Bilirubin Urine: NEGATIVE
Glucose, UA: NEGATIVE mg/dL
Hgb urine dipstick: NEGATIVE
Ketones, ur: 80 mg/dL — AB
Leukocytes,Ua: NEGATIVE
Nitrite: NEGATIVE
Protein, ur: 100 mg/dL — AB
Specific Gravity, Urine: 1.03 (ref 1.005–1.030)
pH: 6 (ref 5.0–8.0)

## 2019-08-29 LAB — CBC
HCT: 38.1 % (ref 36.0–46.0)
Hemoglobin: 13.9 g/dL (ref 12.0–15.0)
MCH: 33.8 pg (ref 26.0–34.0)
MCHC: 36.5 g/dL — ABNORMAL HIGH (ref 30.0–36.0)
MCV: 92.7 fL (ref 80.0–100.0)
Platelets: 339 10*3/uL (ref 150–400)
RBC: 4.11 MIL/uL (ref 3.87–5.11)
RDW: 10.9 % — ABNORMAL LOW (ref 11.5–15.5)
WBC: 14.9 10*3/uL — ABNORMAL HIGH (ref 4.0–10.5)
nRBC: 0 % (ref 0.0–0.2)

## 2019-08-29 LAB — POCT PREGNANCY, URINE: Preg Test, Ur: POSITIVE — AB

## 2019-08-29 MED ORDER — DICYCLOMINE HCL 10 MG PO CAPS
10.0000 mg | ORAL_CAPSULE | Freq: Once | ORAL | Status: AC
  Administered 2019-08-29: 10 mg via ORAL
  Filled 2019-08-29: qty 1

## 2019-08-29 MED ORDER — ONDANSETRON HCL 4 MG/2ML IJ SOLN
4.0000 mg | Freq: Once | INTRAMUSCULAR | Status: AC
  Administered 2019-08-29: 4 mg via INTRAVENOUS
  Filled 2019-08-29: qty 2

## 2019-08-29 MED ORDER — SODIUM CHLORIDE 0.9 % IV BOLUS
1000.0000 mL | Freq: Once | INTRAVENOUS | Status: AC
  Administered 2019-08-29: 1000 mL via INTRAVENOUS

## 2019-08-29 MED ORDER — FAMOTIDINE IN NACL 20-0.9 MG/50ML-% IV SOLN
20.0000 mg | Freq: Once | INTRAVENOUS | Status: AC
  Administered 2019-08-29: 20 mg via INTRAVENOUS
  Filled 2019-08-29: qty 50

## 2019-08-29 MED ORDER — METOCLOPRAMIDE HCL 5 MG/ML IJ SOLN
10.0000 mg | Freq: Once | INTRAMUSCULAR | Status: AC
  Administered 2019-08-29 – 2019-08-30 (×2): 10 mg via INTRAVENOUS
  Filled 2019-08-29: qty 2

## 2019-08-29 NOTE — ED Notes (Signed)
Pt in ultrasound

## 2019-08-29 NOTE — ED Triage Notes (Addendum)
Pt in with co generalized abd pain that started at 1600 today with multiple episodes of vomiting. No diarrhea, or dysuria. Denies any fever. Pt did take zofran pta without any relief.

## 2019-08-29 NOTE — ED Provider Notes (Signed)
[facility] Emergency Department Provider Note ____________________________________________   First MD Initiated Contact with Patient 08/29/19 2116     (approximate)  I have reviewed the triage vital signs and the nursing notes.   HISTORY  Chief Complaint Abdominal Pain    HPI Jenna Harvey is a 28 y.o. female with PMH as noted below, G5P2 with LMP of 11/8 who presents with acute onset of vomiting this afternoon around 4 PM, multiple episodes since then, and associated with diffuse abdominal pain.  The patient states that she saw a few streaks of blood during one of the episodes of vomiting.  She denies any diarrhea, vaginal bleeding, or urinary symptoms.  She did not know she was pregnant.  She denies eating anything unusual and has not traveled.  No past medical history on file.  Patient Active Problem List   Diagnosis Date Noted  . Preterm delivery 02/17/2018    Past Surgical History:  Procedure Laterality Date  . CESAREAN SECTION    . LEEP     unsure of the date, after 2010    Prior to Admission medications   Not on File    Allergies Penicillins  No family history on file.  Social History Social History   Tobacco Use  . Smoking status: Never Smoker  . Smokeless tobacco: Never Used  Substance Use Topics  . Alcohol use: Not Currently  . Drug use: Not Currently    Types: Marijuana    Review of Systems  Constitutional: No fever. Eyes: No redness. ENT: No sore throat. Cardiovascular: Denies chest pain. Respiratory: Denies shortness of breath. Gastrointestinal: Positive for nausea vomiting.  No diarrhea. Genitourinary: Negative for dysuria.  Musculoskeletal: Negative for back pain. Skin: Negative for rash. Neurological: Negative for headache.   ____________________________________________   PHYSICAL EXAM:  VITAL SIGNS: ED Triage Vitals [08/29/19 1918]  Enc Vitals Group     BP 136/88     Pulse Rate 100     Resp 20      Temp 98.8 F (37.1 C)     Temp Source Oral     SpO2 100 %     Weight 174 lb (78.9 kg)     Height 5' (1.524 m)     Head Circumference      Peak Flow      Pain Score 10     Pain Loc      Pain Edu?      Excl. in [place]?     Constitutional: Alert and oriented.  Uncomfortable appearing but in no acute distress. Eyes: Conjunctivae are normal.  No scleral icterus. Head: Atraumatic. Nose: No congestion/rhinnorhea. Mouth/Throat: Mucous membranes are slightly dry.   Neck: Normal range of motion.  Cardiovascular:  Good peripheral circulation. Respiratory: Normal respiratory effort.  No retractions.  Gastrointestinal: Soft with mild diffuse discomfort to palpation but no focal tenderness. No distention.  Genitourinary: No flank tenderness. Musculoskeletal: Extremities warm and well perfused.  Neurologic:  Normal speech and language. No gross focal neurologic deficits are appreciated.  Skin:  Skin is warm and dry. No rash noted. Psychiatric: Mood and affect are normal. Speech and behavior are normal.  ____________________________________________   LABS (all labs ordered are listed, but only abnormal results are displayed)  Labs Reviewed  CBC - Abnormal; Notable for the following components:      Result Value   WBC 14.9 (*)    MCHC 36.5 (*)    RDW 10.9 (*)    All other components within  normal limits  COMPREHENSIVE METABOLIC PANEL - Abnormal; Notable for the following components:   CO2 18 (*)    Glucose, Bld 161 (*)    Total Protein 8.2 (*)    All other components within normal limits  URINALYSIS, COMPLETE (UACMP) WITH MICROSCOPIC - Abnormal; Notable for the following components:   Color, Urine YELLOW (*)    APPearance HAZY (*)    Ketones, ur 80 (*)    Protein, ur 100 (*)    Bacteria, UA RARE (*)    All other components within normal limits  HCG, QUANTITATIVE, PREGNANCY - Abnormal; Notable for the following components:   hCG, Beta Chain, Quant, S 186 (*)    All other  components within normal limits  POCT PREGNANCY, URINE - Abnormal; Notable for the following components:   Preg Test, Ur POSITIVE (*)    All other components within normal limits  LIPASE, BLOOD  POC URINE PREG, ED   ____________________________________________  EKG   ____________________________________________  RADIOLOGY  US pelvis transvaginal: Pending  ____________________________________________   PROCEDURES  Procedure(s) performed: No  Procedures  Critical Care performed: No ____________________________________________   INITIAL IMPRESSION / ASSESSMENT AND PLAN / ED COURSE  Pertinent labs & imaging results that were available during my care of the patient were reviewed by me and considered in my medical decision making (see chart for details).  28 year old female G5P2 with LMP of 11/8 presents with acute onset of nausea and vomiting with diffuse abdominal pain at 4 PM this afternoon.  She has had persistent vomiting since that time.  She denies any diarrhea, fever, urinary symptoms, or vaginal bleeding.  On exam, the patient is uncomfortable appearing but in no acute distress.  She is actively vomiting.  She reports that there were some streaks of blood in her vomitus previously, but she is not having any gross hematemesis at this time.  I witnessed a few episodes of vomiting, and it was yellow in color and nonbilious.  The abdomen is soft with mild diffuse discomfort but no focal tenderness.  Her vital signs are normal.  The remainder of the exam is unremarkable.  Overall I suspect most likely acute gastroenteritis versus hyperemesis gravidarum.  Given the presence of abdominal pain, ectopic pregnancy is also on the differential although unlikely.  We will obtain an ultrasound to evaluate for the location of the pregnancy although I suspect that the patient is too early to identify an IUP.  Given the lack of focal tenderness, I do not suspect cholecystitis or other  hepatobiliary cause, acute appendicitis, or other acute intra-abdominal etiology.    We will give fluids, antiemetic, obtain lab work-up, and reassess.  ----------------------------------------- 11:11 PM on 08/29/2019 -----------------------------------------  The lab work-up is unremarkable except for low bicarb consistent with vomiting.  hCG is 186.  Urinalysis shows RBCs but no evidence of UTI.  The WBC count is elevated.  Patient is pending ultrasound.  I have signed her out to the oncoming physician Dr. Manson Passey.  ____________________________________________   FINAL CLINICAL IMPRESSION(S) / ED DIAGNOSES  Final diagnoses:  Pregnancy of unknown anatomic location  Nausea and vomiting, intractability of vomiting not specified, unspecified vomiting type      NEW MEDICATIONS STARTED DURING THIS VISIT:  New Prescriptions   No medications on file     Note:  This document was prepared using Dragon voice recognition software and may include unintentional dictation errors.    Dionne Bucy, MD 08/29/19 2312

## 2019-08-30 MED ORDER — METOCLOPRAMIDE HCL 10 MG PO TABS: 10.0000 mg | ORAL_TABLET | Freq: Three times a day (TID) | ORAL | 0 refills | Status: DC

## 2019-08-30 MED ORDER — METOCLOPRAMIDE HCL 10 MG PO TABS: 20.0000 mg | ORAL_TABLET | Freq: Once | ORAL | Status: DC

## 2019-08-30 NOTE — ED Notes (Signed)
Pt feels like her nausea has improved since coming to the ED. EDP made aware.

## 2019-08-30 NOTE — ED Provider Notes (Signed)
Assumed care of the patient from Dr. Cherylann Banas.  Patient states ED quantitative 186 ultrasound revealed CLINICAL DATA: 28 year old female quantitative beta HCG 186,  estimated gestational age by LMP 3 weeks 4 days.   EXAM:  OBSTETRIC <14 WK [place] AND TRANSVAGINAL OB US   TECHNIQUE:  Both transabdominal and transvaginal ultrasound examinations were  performed for complete evaluation of the gestation as well as the  maternal uterus, adnexal regions, and pelvic cul-de-sac.  Transvaginal technique was performed to assess early pregnancy.   COMPARISON: None.   FINDINGS:  Intrauterine gestational sac: None   Maternal uterus/adnexae: Endometrial thickening, up to 15  millimeters. Bland appearance of the endometrium.   Trace or small volume simple appearing free fluid in the cul-de-sac  (series 2, image 12).   The right ovary measures 4.2 x 2.3 by 2.0 centimeters (10 millimeter  liter volume) with around thick-walled 24 millimeter area which  appears relatively hypovascular. See series 2, images 30 and 32).  The remaining right ovarian parenchyma appears normal.   Normal left ovary measuring 3.1 x 1.6 by 2.0 centimeters (volume 5  milliliters).   IMPRESSION:  1. No IUP identified. Thickened but bland endometrium.  Right ovary 24 mm thick walled complex area which is indeterminate  for corpus luteum.  Normal left ovary. Trace simple appearing pelvic free fluid.  2. Differential considerations include early IUP, failed IUP, and  ectopic pregnancy.  Recommend serial quantitative beta HCG and repeat ultrasound as  necessary.    Electronically Signed  By: Genevie Ann M.D.  On: 08/29/2019 23:32    Given last menstrual period date suspect ultrasound findings to be secondary to very early IUP however ectopic pregnancy not excluded.  Spoke with the patient at length regarding the necessity of following up with OB/GYN.  IV access was obtained patient given Reglan and IV normal saline 1  L.  Patient states that nausea is improved at this time.  Patient will be prescribed Reglan for home.   Gregor Hams, MD 08/30/19 [phone]

## 2019-12-22 ENCOUNTER — Inpatient Hospital Stay
Admission: EM | Admit: 2019-12-22 | Discharge: 2019-12-23 | DRG: 833 | Disposition: A | Discharge status: Other Acute Inpatient Hospital | Payer: Medicaid Other | Attending: Obstetrics & Gynecology | Admitting: Obstetrics & Gynecology

## 2019-12-22 ENCOUNTER — Other Ambulatory Visit: Payer: Self-pay

## 2019-12-22 DIAGNOSIS — O42919 Preterm premature rupture of membranes, unspecified as to length of time between rupture and onset of labor, unspecified trimester: Secondary | ICD-10-CM

## 2019-12-22 DIAGNOSIS — Z3A20 20 weeks gestation of pregnancy: Secondary | ICD-10-CM

## 2019-12-22 DIAGNOSIS — Z880 Allergy status to penicillin: Secondary | ICD-10-CM

## 2019-12-22 DIAGNOSIS — O3432 Maternal care for cervical incompetence, second trimester: Secondary | ICD-10-CM

## 2019-12-22 DIAGNOSIS — O418X20 Other specified disorders of amniotic fluid and membranes, second trimester, not applicable or unspecified: Secondary | ICD-10-CM

## 2019-12-22 DIAGNOSIS — O4102X0 Oligohydramnios, second trimester, not applicable or unspecified: Secondary | ICD-10-CM

## 2019-12-22 DIAGNOSIS — O42912 Preterm premature rupture of membranes, unspecified as to length of time between rupture and onset of labor, second trimester: Principal | ICD-10-CM | POA: Diagnosis present

## 2019-12-22 DIAGNOSIS — O321XX0 Maternal care for breech presentation, not applicable or unspecified: Secondary | ICD-10-CM

## 2019-12-22 DIAGNOSIS — O4290 Premature rupture of membranes, unspecified as to length of time between rupture and onset of labor, unspecified weeks of gestation: Secondary | ICD-10-CM | POA: Diagnosis present

## 2019-12-22 DIAGNOSIS — O09212 Supervision of pregnancy with history of pre-term labor, second trimester: Secondary | ICD-10-CM

## 2019-12-22 LAB — URINALYSIS, ROUTINE W REFLEX MICROSCOPIC
Bacteria, UA: NONE SEEN
Bilirubin Urine: NEGATIVE
Glucose, UA: NEGATIVE mg/dL
Hgb urine dipstick: NEGATIVE
Ketones, ur: NEGATIVE mg/dL
Nitrite: NEGATIVE
Protein, ur: 30 mg/dL — AB
Specific Gravity, Urine: 1.005 (ref 1.005–1.030)
WBC, UA: >50 WBC/hpf — ABNORMAL HIGH (ref 0–5)
pH: 7 (ref 5.0–8.0)

## 2019-12-22 LAB — URINE DRUG SCREEN, QUALITATIVE (ARMC ONLY)
Amphetamines, Ur Screen: NOT DETECTED
Barbiturates, Ur Screen: NOT DETECTED
Benzodiazepine, Ur Scrn: NOT DETECTED
Cannabinoid 50 Ng, Ur ~~LOC~~: POSITIVE — AB
Cocaine Metabolite,Ur ~~LOC~~: NOT DETECTED
MDMA (Ecstasy)Ur Screen: NOT DETECTED
Methadone Scn, Ur: NOT DETECTED
Opiate, Ur Screen: NOT DETECTED
Phencyclidine (PCP) Ur S: NOT DETECTED
Tricyclic, Ur Screen: NOT DETECTED

## 2019-12-22 LAB — RUPTURE OF MEMBRANE (ROM)PLUS: Rom Plus: POSITIVE

## 2019-12-22 MED ORDER — ONDANSETRON HCL 4 MG/2ML IJ SOLN: 4.0000 mg | Freq: Four times a day (QID) | INTRAMUSCULAR | Status: DC | PRN

## 2019-12-22 MED ORDER — ACETAMINOPHEN 325 MG PO TABS: 650.0000 mg | ORAL_TABLET | ORAL | Status: DC | PRN

## 2019-12-22 NOTE — OB Triage Note (Signed)
Patient came in for observation for possible premature ROM at 2145. Patient denies uterine contractions. Patient complains of leaking of clear fluid but denies vaginal bleeding and spotting. Vital signs stable and patient afebrile. FHR doppler 150. Patient in room alone. Toco monitor applied and tracing.

## 2019-12-22 NOTE — H&P (Signed)
Obstetrics Admission History & Physical   Concern for Rupture of Membranes   HPI:  29 y.o. [id number] @ [redacted]w[redacted]d (05/10/2020, by Patient Reported). Admitted on 12/22/2019:   Presents for concern over leakage of vaginal fluid in a gush at 2145 this evening.  She has folllowing history: 4 AB 11/09/18 107w2d  Birth Comments: D&E s/p previable PPROM; suspected PTL vs abruption  3 Preterm 02/16/18 [redacted]w[redacted]d F Vag-Spont Y FD  Complications: Preterm labor with preterm delivery  2 Preterm 11/20/14 [redacted]w[redacted]d 580 g (1 lb 4.5 oz) F CS-Classical   Birth Comments: PTL with preterm ROM  Complications: Breech presentation  Interval - - - 2014 LEEP 1 Term 2010 [redacted]w[redacted]d 2863 g (6 lb 5 oz) F Vag-Spont EPI N LIV    Prenatal care at: at another place  [facility]. Pregnancy complicated by prior h/o PPROM, PTD, likely cervical inmcompetence.  Cerclage placed this pregnancy on 10/31/2019..  ROS: 29 y.o. A review of systems was performed and negative, except as stated in the above HPI. PMHx: History reviewed. No pertinent past medical history. PSHx:  Past Surgical History:  Procedure Laterality Date  . CESAREAN SECTION    . LEEP     unsure of the date, after 2010   Medications:  Medications Prior to Admission  Medication Sig Dispense Refill Last Dose  . hydroxyprogesterone caproate (MAKENA) 250 mg/mL OIL injection Inject 250 mg into the muscle once a week.   Past Week at Unknown time  . Prenatal Vit-Fe Fumarate-FA (PRENATAL MULTIVITAMIN) TABS tablet Take 1 tablet by mouth daily at 12 noon.   12/22/2019 at Unknown time  . metoCLOPramide (REGLAN) 10 MG tablet Take 1 tablet (10 mg total) by mouth 3 (three) times daily with meals. 90 tablet 0    Allergies: is allergic to penicillins. OBHx:  OB History  Gravida Para Term Preterm AB Living  4 3 1 2   1   SAB TAB Ectopic Multiple Live Births        0 2    # Outcome Date GA Lbr Len/2nd Weight Sex Delivery Anes PTL Lv  4 Current           3 Preterm 2016 [redacted]w[redacted]d    CS-Classical  Y DEC  2  Term 2010     Vag-Spont  N LIV  1 Preterm      Vag-Spont   FD   ZDG:UYQIHKVQ/QVZDGLOVFIEP except as detailed in HPI.Marland Kitchen  No family history of birth defects. Soc Hx: Alcohol: none and Recreational drug use: none  Objective:  There were no vitals filed for this visit. Constitutional: Well nourished, well developed female in no acute distress.  HEENT: normal Skin: Warm and dry.  Cardiovascular:Regular rate and rhythm.   Extremity: trace to 1+ bilateral pedal edema Respiratory: Clear to auscultation bilateral. Normal respiratory effort Abdomen: gravid, ND, FHT present, without guarding, without rebound tenderness on exam Back: no CVAT Neuro: DTRs 2+, Cranial nerves grossly intact Psych: Alert and Oriented x3. No memory deficits. Normal mood and affect.  MS: normal gait, normal bilateral lower extremity ROM/strength/stability.  Pelvic exam: is not limited by body habitus EGBUS: within normal limits Vagina: within normal limits and with normal mucosa  SPE: POOLING POSITIVE Cervix: CERVIX: 0 cm dilated, 0% effaced, -3 station Uterus: No contractions observed for 45 minutes.  Adnexa: normal adnexa  [place]- NEG  Assessment & Plan:   29 y.o. [id number] @ [redacted]w[redacted]d, Admitted on 12/22/2019:Concern for cervical incompetence w cerclage in place; now with leakage of fluid and concern for PPROM  Labs Stitch intact on exam, cervix closed No s/sx labor  Plan IV ABX for 48 hours if ROM+ is positive.  Also [place]. If neg; reassure and send home UA as well, check for UTI  Annamarie Major, MD, [facility], [facility] 12/22/2019  11:09 PM

## 2019-12-23 ENCOUNTER — Inpatient Hospital Stay: Payer: Medicaid Other

## 2019-12-23 ENCOUNTER — Encounter: Payer: Self-pay | Admitting: Obstetrics & Gynecology

## 2019-12-23 DIAGNOSIS — Z880 Allergy status to penicillin: Secondary | ICD-10-CM | POA: Diagnosis not present

## 2019-12-23 DIAGNOSIS — O42919 Preterm premature rupture of membranes, unspecified as to length of time between rupture and onset of labor, unspecified trimester: Secondary | ICD-10-CM | POA: Diagnosis present

## 2019-12-23 DIAGNOSIS — O42912 Preterm premature rupture of membranes, unspecified as to length of time between rupture and onset of labor, second trimester: Secondary | ICD-10-CM | POA: Diagnosis present

## 2019-12-23 DIAGNOSIS — O321XX0 Maternal care for breech presentation, not applicable or unspecified: Secondary | ICD-10-CM | POA: Diagnosis present

## 2019-12-23 DIAGNOSIS — O4102X0 Oligohydramnios, second trimester, not applicable or unspecified: Secondary | ICD-10-CM | POA: Diagnosis not present

## 2019-12-23 DIAGNOSIS — Z3A20 20 weeks gestation of pregnancy: Secondary | ICD-10-CM | POA: Diagnosis not present

## 2019-12-23 DIAGNOSIS — O3432 Maternal care for cervical incompetence, second trimester: Secondary | ICD-10-CM | POA: Diagnosis not present

## 2019-12-23 DIAGNOSIS — O418X20 Other specified disorders of amniotic fluid and membranes, second trimester, not applicable or unspecified: Secondary | ICD-10-CM | POA: Diagnosis not present

## 2019-12-23 LAB — CBC
HCT: 32.6 % — ABNORMAL LOW (ref 36.0–46.0)
Hemoglobin: 11.3 g/dL — ABNORMAL LOW (ref 12.0–15.0)
MCH: 34.7 pg — ABNORMAL HIGH (ref 26.0–34.0)
MCHC: 34.7 g/dL (ref 30.0–36.0)
MCV: 100 fL (ref 80.0–100.0)
Platelets: 280 10*3/uL (ref 150–400)
RBC: 3.26 MIL/uL — ABNORMAL LOW (ref 3.87–5.11)
RDW: 11.5 % (ref 11.5–15.5)
WBC: 15.5 10*3/uL — ABNORMAL HIGH (ref 4.0–10.5)
nRBC: 0 % (ref 0.0–0.2)

## 2019-12-23 LAB — TYPE AND SCREEN
ABO/RH(D): B POS
Antibody Screen: NEGATIVE

## 2019-12-23 MED ORDER — ZOLPIDEM TARTRATE 5 MG PO TABS: 5.0000 mg | ORAL_TABLET | Freq: Every evening | ORAL | Status: DC | PRN

## 2019-12-23 MED ORDER — DOCUSATE SODIUM 100 MG PO CAPS: 100.0000 mg | ORAL_CAPSULE | Freq: Every day | ORAL | Status: DC

## 2019-12-23 MED ORDER — KCL IN DEXTROSE-NACL 10-5-0.45 MEQ/L-%-% IV SOLN
INTRAVENOUS | Status: DC
  Filled 2019-12-23 (×2): qty 1000

## 2019-12-23 MED ORDER — ACETAMINOPHEN 325 MG PO TABS: 650.0000 mg | ORAL_TABLET | ORAL | Status: DC | PRN

## 2019-12-23 MED ORDER — DEXTROSE 5 % IV SOLN
1000.0000 mg | Freq: Once | INTRAVENOUS | Status: AC
  Administered 2019-12-23: 1000 mg via INTRAVENOUS
  Filled 2019-12-23: qty 1000

## 2019-12-23 MED ORDER — CALCIUM CARBONATE ANTACID 500 MG PO CHEW: 2.0000 | CHEWABLE_TABLET | ORAL | Status: DC | PRN

## 2019-12-23 MED ORDER — PRENATAL MULTIVITAMIN CH: 1.0000 | ORAL_TABLET | Freq: Every day | ORAL | Status: DC

## 2019-12-23 MED ORDER — CEFAZOLIN SODIUM-DEXTROSE 1-4 GM/50ML-% IV SOLN
1.0000 g | Freq: Three times a day (TID) | INTRAVENOUS | Status: DC
  Administered 2019-12-23 (×2): 1 g via INTRAVENOUS
  Filled 2019-12-23 (×2): qty 50

## 2019-12-23 NOTE — Progress Notes (Addendum)
MFM note I was requested by Farrel Conners CNM to see Jenna Harvey On review of her notes from Care Everywhere she has a complicated obstetric history. She is a 29 year old gravida 5 para 1-1-2-1 at 20w 1d. She has a history of preterm delivery and neonatal loss.  She has been followed during this pregnancy at [facility]- MFM.  She underwent a Shirodkar type cerclage on 10/31/2019 with Dr. Waynard Reeds at [facility] there is a single Ethibond #5 that was tied anteriorly. She has been receiving weekly 17 P injections. Last night she experienced a gush of fluid and presented to [facility] for evaluation. Fetus was breech, there is a fetal heart rate , there was oligohydramnios. Latency antibiotics were administered with ampicillin and azithromycin. There is been no bleeding, no fever was reported. White blood count was 15.5 no differential on the chart.  When I went in to see the patient she denied any contractions. She is quite distraught.  She said "nobody will tell me what is going on".  "If there is no hope , why am I  not getting my cerclage out now".  I attempted to dialogue with her and discuss the risks of infection and labor, but she is quite upset and kept repeating that "no one will tell me what is going on." I offered to speak to her family on the phone, she declined.  Patient was agreeable to be transferred to [facility] this was arranged by the midwife. I will defer discussion of cerclage removal to the team at [facility]. I agree with the latency antibiotics being administered until the patient determines her preferred course of action for the pregnancy. I suggest adding on a differential to her CBC, if there are signs of infection then expectant management may not be in the best interest of the patient's health. Currently she denies fever chills or contractions, we will leave the cerclage in place for transfer.  Jimmey Ralph, MD   MFM consultant [phone]

## 2019-12-23 NOTE — Final Progress Note (Signed)
Physician Final Progress Note  Patient ID: Jenna Harvey MRN: [id number] DOB/AGE: [birth year] 29 y.o.  Admit date: 12/22/2019 Admitting provider: Nadara Mustard, MD Discharge date: 12/23/2019   Admission Diagnoses: Premature preterm rupture of membranes at [redacted] weeks gestation Cerclage in place History of second trimester losses  Discharge Diagnoses:  Same as above  Consults: [facility]  Significant Findings/ Diagnostic Studies: Jenna Harvey is a 29 year old gravida 5 para 1-1-2-1 with EDC=05/10/2020 who presented last night to L&D with complaints of leakage of fluid. She has a complicated obstetrical history of preterm delivery and neonatal loss.  She has been followed during this pregnancy at [facility]- MFM.  She underwent a Shirodkar type cerclage on 10/31/2019 with Dr. Waynard Reeds at [facility] there is a single Ethibond #5 that was tied anteriorly. She has been receiving weekly 17 P injections. Last night she experienced a gush of fluid and presented to [facility] for evaluation. The ROM plus was positive and the cervix was long/ thick and closed and the cerclage was intact. She had no bleeding. She was feeling fetal movement and fetal heart tones were WNL.  She was started on latency antibiotics (azithromycin 1000 mgm x 1 and Ancef 1 Gm every 8 hours.) On ultrasound today the fetus was breech, fetal heart rate was 145 , there was oligohydramnios. Her admitting WBC was 15.5K and she remains afebrile. UA was suspicious for a UTI and urine culture is pending. FHTs 140 and +FM noted on ultrasound prior to transfer which patient requested. Dr Bronson Curb is the accepting physician at [facility].  Results for orders placed or performed during the hospital encounter of 12/22/19 (from the past 24 hour(s))  Urinalysis, Routine w reflex microscopic     Status: Abnormal   Collection Time: 12/22/19 10:56 PM  Result Value Ref Range   Color, Urine YELLOW (A) YELLOW   APPearance HAZY (A) CLEAR   Specific  Gravity, Urine 1.005 1.005 - 1.030   pH 7.0 5.0 - 8.0   Glucose, UA NEGATIVE NEGATIVE mg/dL   Hgb urine dipstick NEGATIVE NEGATIVE   Bilirubin Urine NEGATIVE NEGATIVE   Ketones, ur NEGATIVE NEGATIVE mg/dL   Protein, ur 30 (A) NEGATIVE mg/dL   Nitrite NEGATIVE NEGATIVE   Leukocytes,Ua LARGE (A) NEGATIVE   RBC / HPF 0-5 0 - 5 RBC/hpf   WBC, UA >50 (H) 0 - 5 WBC/hpf   Bacteria, UA NONE SEEN NONE SEEN   Squamous Epithelial / LPF 6-10 0 - 5   WBC Clumps PRESENT    Mucus PRESENT   Urine Drug Screen, Qualitative ([facility] only)     Status: Abnormal   Collection Time: 12/22/19 10:56 PM  Result Value Ref Range   Tricyclic, Ur Screen NONE DETECTED NONE DETECTED   Amphetamines, Ur Screen NONE DETECTED NONE DETECTED   MDMA (Ecstasy)Ur Screen NONE DETECTED NONE DETECTED   Cocaine Metabolite,Ur [place] NONE DETECTED NONE DETECTED   Opiate, Ur Screen NONE DETECTED NONE DETECTED   Phencyclidine (PCP) Ur S NONE DETECTED NONE DETECTED   Cannabinoid 50 Ng, Ur [place] POSITIVE (A) NONE DETECTED   Barbiturates, Ur Screen NONE DETECTED NONE DETECTED   Benzodiazepine, Ur Scrn NONE DETECTED NONE DETECTED   Methadone Scn, Ur NONE DETECTED NONE DETECTED  ROM Plus ([facility] only)     Status: None   Collection Time: 12/22/19 10:56 PM  Result Value Ref Range   Rom Plus POSITIVE   Type and screen [facility]     Status: None   Collection  Time: 12/23/19  1:16 AM  Result Value Ref Range   ABO/RH(D) B POS    Antibody Screen NEG    Sample Expiration      12/26/2019,2359 Performed at [facility], [address] [place], [place] [address]   CBC on admission     Status: Abnormal   Collection Time: 12/23/19  6:26 AM  Result Value Ref Range   WBC 15.5 (H) 4.0 - 10.5 K/uL   RBC 3.26 (L) 3.87 - 5.11 MIL/uL   Hemoglobin 11.3 (L) 12.0 - 15.0 g/dL   HCT 32.6 (L) 36.0 - 46.0 %   MCV 100.0 80.0 - 100.0 fL   MCH 34.7 (H) 26.0 - 34.0 pg   MCHC 34.7 30.0 - 36.0 g/dL   RDW 11.5 11.5 - 15.5 %    Platelets 280 150 - 400 K/uL   nRBC 0.0 0.0 - 0.2 %   US OB Limited  Result Date: 12/23/2019 CLINICAL DATA:  Premature rupture of membranes. EXAM: LIMITED OBSTETRIC ULTRASOUND FINDINGS: Number of Fetuses: 1 Heart Rate:  145 bpm Movement: Very minimal Presentation: Breech Placental Location: Anterior Previa: Inferior placental margin could not be clearly discriminated due to the lack of amniotic fluid. Amniotic Fluid (Subjective):  Markedly decreased. AFI: 0 cm BPD: 4.67 cm 20 w  1 d MATERNAL FINDINGS: Cervix:  Appears closed. Uterus/Adnexae: No abnormality visualized. IMPRESSION: No measurable amniotic fluid on this study with minimal fetal movement detected. Imaging features compatible with reported premature rupture of membranes. This exam is performed on an emergent basis and does not comprehensively evaluate fetal size, dating, or anatomy; follow-up complete OB US should be considered if further fetal assessment is warranted. Electronically Signed   By: Misty Stanley M.D.   On: 12/23/2019 09:46   Procedures: ultrasound  Discharge Condition:stable  Disposition: Discharge disposition: 02-Transferred to [facility]       Diet: Regular diet  Discharge Activity: Bedrest with BRP   Allergies as of 12/23/2019      Reactions   Penicillins Hives      Medication List    STOP taking these medications   hydroxyprogesterone caproate 250 mg/mL Oil injection Commonly known as: MAKENA   metoCLOPramide 10 MG tablet Commonly known as: REGLAN   prenatal multivitamin Tabs tablet        Total time spent taking care of this patient: 30 minutes  Signed: Dalia Heading 12/23/2019, 12:23 PM

## 2019-12-23 NOTE — Progress Notes (Signed)
L&D progress Note    S: " I want to be transferred to my doctors at [facility]". Denies contractions. Dr Leatha Gilding, MFM, up to talk with patient regarding treatment options and prognosis.  O: BP (!) 101/46 (BP Location: Right Arm)   Pulse 91   Temp 97.7 F (36.5 C) (Oral)   Resp 14   LMP 08/04/2019   Remains afebrile Ultrasound today: no measurable fluid. FHTs WNL. Decreased fetal movement while doing ultrasound. Fetus is breech. Cervix is long, about 5cm in length Anterior placenta. Inferior margin not clearly seen.  Urinalysis suspicious for UTI Urinalysis    Component Value Date/Time   COLORURINE YELLOW (A) 12/22/2019 2256   APPEARANCEUR HAZY (A) 12/22/2019 2256   LABSPEC 1.005 12/22/2019 2256   PHURINE 7.0 12/22/2019 2256   GLUCOSEU NEGATIVE 12/22/2019 2256   HGBUR NEGATIVE 12/22/2019 2256   BILIRUBINUR NEGATIVE 12/22/2019 2256   KETONESUR NEGATIVE 12/22/2019 2256   PROTEINUR 30 (A) 12/22/2019 2256   NITRITE NEGATIVE 12/22/2019 2256   LEUKOCYTESUR LARGE (A) 12/22/2019 2256  >50 WBCs, o-5 RBCs, WBC clumps, no bacteria seen   A: PPROM at [facility] Patient desires transfer  R//O UTI  P: Urine culture Called and spoke with Dr Karen Chafe, MFM, who accepts patient in transfer Awaiting information on transport.  Farrel Conners, CNM

## 2019-12-23 NOTE — Progress Notes (Signed)
Progress Note  Admission Date: 12/22/2019 Current Date: 12/23/2019  Azlyn Wingler is a 29 y.o. [id number] HD#1 @ [redacted]w[redacted]d by dates and [place].  History complicated by: Patient Active Problem List   Diagnosis Date Noted  . Preterm premature rupture of membranes (PPROM) with unknown onset of labor 12/23/2019  . Leakage of amniotic fluid 12/22/2019  . Preterm delivery 02/17/2018   ROS and patient/family/surgical history, located on admission H&P note dated 12/22/2019, have been reviewed, and there are no changes except as noted below  No pain or ctxs FHT 150s Tests returned positive for ROM PLUS - PPROM at early gest age  Results for orders placed or performed during the hospital encounter of 12/22/19  Urinalysis, Routine w reflex microscopic  Result Value Ref Range   Color, Urine YELLOW (A) YELLOW   APPearance HAZY (A) CLEAR   Specific Gravity, Urine 1.005 1.005 - 1.030   pH 7.0 5.0 - 8.0   Glucose, UA NEGATIVE NEGATIVE mg/dL   Hgb urine dipstick NEGATIVE NEGATIVE   Bilirubin Urine NEGATIVE NEGATIVE   Ketones, ur NEGATIVE NEGATIVE mg/dL   Protein, ur 30 (A) NEGATIVE mg/dL   Nitrite NEGATIVE NEGATIVE   Leukocytes,Ua LARGE (A) NEGATIVE   RBC / HPF 0-5 0 - 5 RBC/hpf   WBC, UA >50 (H) 0 - 5 WBC/hpf   Bacteria, UA NONE SEEN NONE SEEN   Squamous Epithelial / LPF 6-10 0 - 5   WBC Clumps PRESENT    Mucus PRESENT   Urine Drug Screen, Qualitative ([facility] only)  Result Value Ref Range   Tricyclic, Ur Screen NONE DETECTED NONE DETECTED   Amphetamines, Ur Screen NONE DETECTED NONE DETECTED   MDMA (Ecstasy)Ur Screen NONE DETECTED NONE DETECTED   Cocaine Metabolite,Ur [place] NONE DETECTED NONE DETECTED   Opiate, Ur Screen NONE DETECTED NONE DETECTED   Phencyclidine (PCP) Ur S NONE DETECTED NONE DETECTED   Cannabinoid 50 Ng, Ur [place] POSITIVE (A) NONE DETECTED   Barbiturates, Ur Screen NONE DETECTED NONE DETECTED   Benzodiazepine, Ur Scrn NONE DETECTED NONE DETECTED   Methadone Scn, Ur NONE DETECTED NONE  DETECTED  ROM Plus ([facility] only)  Result Value Ref Range   Rom Plus POSITIVE      Assessment & Plan:   Problem List Items Addressed This Visit      Other   Preterm premature rupture of membranes (PPROM) with unknown onset of labor   Relevant Orders   US OB Comp + 14 Wk    Plan Azithromycin one dose and Ancef 1 g every 8 hours per protocol (for 48 hours) then shift to oral therapy and outpatient management thereafter until 23 weeks viability.  Ancef due to PCN allergy and low risk anaphylaxis.  "Women with penicillin allergy ?Low risk for anaphylaxis - If the patient's history suggests a low risk for anaphylaxis suggest : .Azithromycin 1 gram orally upon admission, plus .Cefazolin 1 gram intravenously every 8 hours for 48 hours, followed by .Cephalexin 500 mg orally four times daily for five days"  [place] later this am for assessment of postiion, amniotic fluid MFM consult, will need admission at [facility] or [facility] at 23 weeks for management then  Monitor for fever, signs of infection, and signs of PTL  Risks of PTL and fetal loss discussed  Annamarie Major, MD, [facility], [facility] 12/23/2019  1:36 AM

## 2019-12-24 LAB — URINE CULTURE: Special Requests: NORMAL

## 2019-12-24 MED ORDER — POLYETHYLENE GLYCOL 3350 17 GM/SCOOP PO POWD
17.00 | ORAL | Status: DC
Start: ? — End: 2019-12-24

## 2019-12-24 MED ORDER — CEFAZOLIN SODIUM 1 G IJ SOLR
1.00 | INTRAMUSCULAR | Status: DC
Start: 2019-12-24 — End: 2019-12-24

## 2019-12-24 MED ORDER — SENNOSIDES 8.6 MG PO TABS
1.00 | ORAL_TABLET | ORAL | Status: DC
Start: ? — End: 2019-12-24

## 2019-12-24 MED ORDER — PNV PRENATAL PLUS MULTIVITAMIN 27-1 MG PO TABS
1.00 | ORAL_TABLET | ORAL | Status: DC
Start: 2019-12-26 — End: 2019-12-24

## 2019-12-24 MED ORDER — MELATONIN 3 MG PO TABS
3.00 | ORAL_TABLET | ORAL | Status: DC
Start: ? — End: 2019-12-24

## 2019-12-24 MED ORDER — ACETAMINOPHEN 325 MG PO TABS
650.00 | ORAL_TABLET | ORAL | Status: DC
Start: ? — End: 2019-12-24

## 2019-12-26 MED ORDER — METRONIDAZOLE 500 MG PO TABS
500.00 | ORAL_TABLET | ORAL | Status: DC
Start: 2019-12-25 — End: 2019-12-26

## 2019-12-26 MED ORDER — CEFDINIR 300 MG PO CAPS
300.00 | ORAL_CAPSULE | ORAL | Status: DC
Start: 2019-12-25 — End: 2019-12-26

## 2019-12-26 NOTE — Discharge Summary (Signed)
Discharge Summary Note   Patient ID:  Jenna Harvey  MRN: [id number]  DOB/AGE: March 26, 1991 29 y.o.   Admit date: 12/22/2019  Admitting provider: Gae Dry, MD  Discharge date: 12/23/2019    Admission Diagnoses: Premature preterm rupture of membranes at [redacted] weeks gestation  Cerclage in place  History of second trimester losses    Discharge Diagnoses:  Same as above   Consults: [facility]    Significant Findings/ Diagnostic Studies: Jenna Harvey is a 29 year old gravida 5 para 1-1-2-1 with EDC=05/10/2020 who presented last night to L&D with complaints of leakage of fluid.  She has a complicated obstetrical history of preterm delivery and neonatal loss. She has been followed during this pregnancy at [facility]- MFM. She underwent a Shirodkar type cerclage on 10/31/2019 with Dr. Lisette Abu at [facility] there is a single Ethibond #5 that was tied anteriorly. She has been receiving weekly 17 P injections.  Last night she experienced a gush of fluid and presented to [facility] labor and delivery for evaluation. The ROM plus was positive and the cervix was long/ thick and closed and the cerclage was intact. She had no bleeding. She was feeling fetal movement and fetal heart tones were WNL. She was started on latency antibiotics (azithromycin 1000 mgm x 1 and Ancef 1 Gm every 8 hours.)  On ultrasound today the fetus was breech, fetal heart rate was 145 , there was oligohydramnios.  Her admitting WBC was 15.5K and she remains afebrile. UA was suspicious for a UTI and urine culture is pending. FHTs 140 and +FM noted on ultrasound prior to transfer which patient requested.  Dr Solon Augusta is the accepting physician at [facility].     Lab Results:  Recent Results (from the past 2160 hour(s))  Urinalysis, Routine w reflex microscopic     Status: Abnormal   Collection Time: 12/22/19 10:56 PM  Result Value Ref Range   Color, Urine YELLOW (A) YELLOW   APPearance HAZY (A) CLEAR    Specific Gravity, Urine 1.005 1.005 - 1.030   pH 7.0 5.0 - 8.0   Glucose, UA NEGATIVE NEGATIVE mg/dL   Hgb urine dipstick NEGATIVE NEGATIVE   Bilirubin Urine NEGATIVE NEGATIVE   Ketones, ur NEGATIVE NEGATIVE mg/dL   Protein, ur 30 (A) NEGATIVE mg/dL   Nitrite NEGATIVE NEGATIVE   Leukocytes,Ua LARGE (A) NEGATIVE   RBC / HPF 0-5 0 - 5 RBC/hpf   WBC, UA >50 (H) 0 - 5 WBC/hpf   Bacteria, UA NONE SEEN NONE SEEN   Squamous Epithelial / LPF 6-10 0 - 5   WBC Clumps PRESENT    Mucus PRESENT     Comment: Performed at [facility], [address] [place], [place] [address]  Urine Drug Screen, Qualitative ([facility] only)     Status: Abnormal   Collection Time: 12/22/19 10:56 PM  Result Value Ref Range   Tricyclic, Ur Screen NONE DETECTED NONE DETECTED   Amphetamines, Ur Screen NONE DETECTED NONE DETECTED   MDMA (Ecstasy)Ur Screen NONE DETECTED NONE DETECTED   Cocaine Metabolite,Ur [place] NONE DETECTED NONE DETECTED   Opiate, Ur Screen NONE DETECTED NONE DETECTED   Phencyclidine (PCP) Ur S NONE DETECTED NONE DETECTED   Cannabinoid 50 Ng, Ur [place] POSITIVE (A) NONE DETECTED   Barbiturates, Ur Screen NONE DETECTED NONE DETECTED   Benzodiazepine, Ur Scrn NONE DETECTED NONE DETECTED   Methadone Scn, Ur NONE DETECTED NONE DETECTED    Comment: (NOTE) Tricyclics + metabolites, urine    Cutoff 1000 ng/mL Amphetamines + metabolites,  urine  Cutoff 1000 ng/mL MDMA (Ecstasy), urine              Cutoff 500 ng/mL Cocaine Metabolite, urine          Cutoff 300 ng/mL Opiate + metabolites, urine        Cutoff 300 ng/mL Phencyclidine (PCP), urine         Cutoff 25 ng/mL Cannabinoid, urine                 Cutoff 50 ng/mL Barbiturates + metabolites, urine  Cutoff 200 ng/mL Benzodiazepine, urine              Cutoff 200 ng/mL Methadone, urine                   Cutoff 300 ng/mL The urine drug screen provides only a preliminary, unconfirmed analytical test result and should not be used for non-medical purposes.  Clinical consideration and professional judgment should be applied to any positive drug screen result due to possible interfering substances. A more specific alternate chemical method must be used in order to obtain a confirmed analytical result. Gas chromatography / mass spectrometry (GC/MS) is the preferred confirmat ory method. Performed at [facility], [address] [place], [place] [address]   ROM Plus [facility] only)     Status: None   Collection Time: 12/22/19 10:56 PM  Result Value Ref Range   Rom Plus POSITIVE     Comment: Performed at [facility], [address] [place], [place] [address]  Urine Culture     Status: Abnormal   Collection Time: 12/22/19 10:56 PM   Specimen: Urine, Random  Result Value Ref Range   Specimen Description      URINE, RANDOM Performed at [facility] [address] [place], [place] [address]    Special Requests      Normal Performed at [facility], [address] [place], [place] [address]    Culture MULTIPLE SPECIES PRESENT, SUGGEST RECOLLECTION (A)    Report Status 12/24/2019 FINAL   Type and screen [facility]     Status: None   Collection Time: 12/23/19  1:16 AM  Result Value Ref Range   ABO/RH(D) B POS    Antibody Screen NEG    Sample Expiration      12/26/2019,2359 Performed at [facility], [address] [place], [place] [address]   CBC on admission     Status: Abnormal   Collection Time: 12/23/19  6:26 AM  Result Value Ref Range   WBC 15.5 (H) 4.0 - 10.5 K/uL   RBC 3.26 (L) 3.87 - 5.11 MIL/uL   Hemoglobin 11.3 (L) 12.0 - 15.0 g/dL   HCT 85.2 (L) 77.8 - 24.2 %   MCV 100.0 80.0 - 100.0 fL   MCH 34.7 (H) 26.0 - 34.0 pg   MCHC 34.7 30.0 - 36.0 g/dL   RDW 35.3 61.4 - 43.1 %   Platelets 280 150 - 400 K/uL   nRBC 0.0 0.0 - 0.2 %    Comment: Performed at [facility], [address] [place], [place] [address]   Result Date: 12/23/2019   CLINICAL DATA: Premature rupture of membranes. EXAM: LIMITED OBSTETRIC ULTRASOUND FINDINGS: Number of Fetuses: 1 Heart Rate: 145 bpm Movement: Very minimal Presentation: Breech Placental Location: Anterior Previa: Inferior placental margin could not be clearly discriminated due to the lack of amniotic fluid. Amniotic Fluid (Subjective): Markedly decreased. AFI: 0 cm BPD: 4.67  cm 20 w 1 d MATERNAL FINDINGS: Cervix: Appears closed. Uterus/Adnexae: No abnormality visualized. IMPRESSION: No measurable amniotic fluid on this study with minimal fetal movement detected. Imaging features compatible with reported premature rupture of membranes. This exam is performed on an emergent basis and does not comprehensively evaluate fetal size, dating, or anatomy; follow-up complete OB US should be considered if further fetal assessment is warranted. Electronically Signed By: Kennith Center M.D. On: 12/23/2019 09:46   Procedures: ultrasound  Discharge Condition:stable  Disposition: Discharge disposition: 02-Transferred to [facility])  Diet: Regular diet  Discharge Activity: Bedrest with BRP        Allergies as of 12/23/2019      Reactions   Penicillins Hives    Total time spent taking care of this patient: 30 minutes  Signed:  Farrel Conners  12/23/2019, 12:23 PM

## 2020-07-05 IMAGING — US US OB < 14 WEEKS - US OB TV
2 series · 13 of 28 positions shown · non-contrast
Comparison: None.

CLINICAL DATA: 28-year-old female quantitative beta HCG 186,
estimated gestational age by LMP 3 weeks 4 days.

EXAM:
OBSTETRIC <14 WK US AND TRANSVAGINAL OB US
TECHNIQUE: Both transabdominal and transvaginal ultrasound examinations were
performed for complete evaluation of the gestation as well as the
maternal uterus, adnexal regions, and pelvic cul-de-sac.
Transvaginal technique was performed to assess early pregnancy.

[Series 1: us ob < 14 weeks - us ob tv · 8 of 78 slices shown (1 of 2)]
[im 5/78]
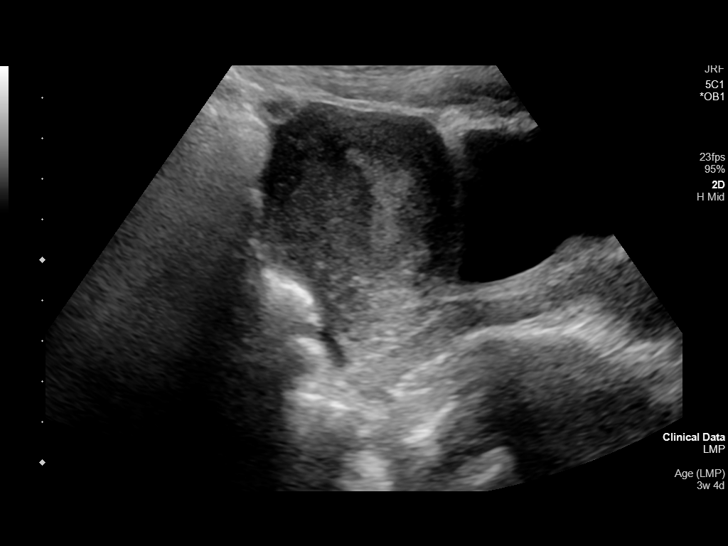
[im 15/78]
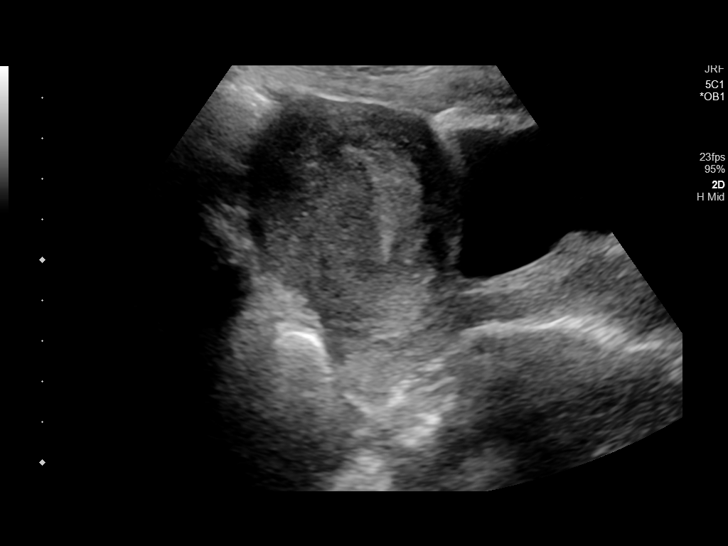
[im 25/78]
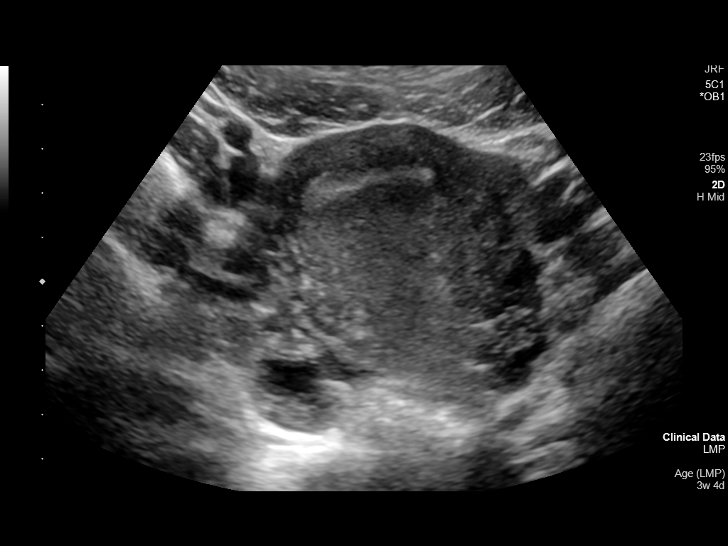
[im 34/78]
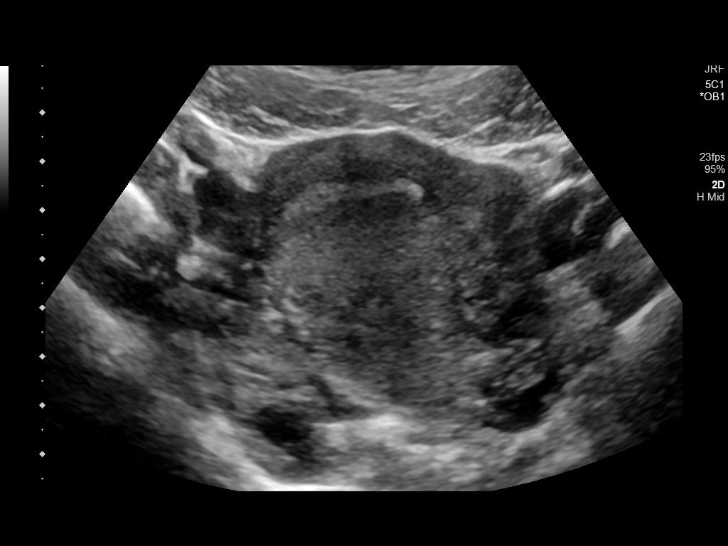
[im 44/78]
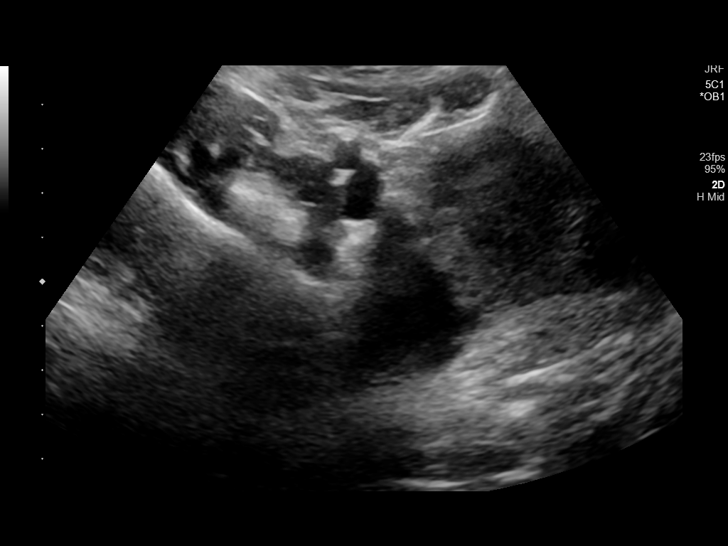
[im 53/78]
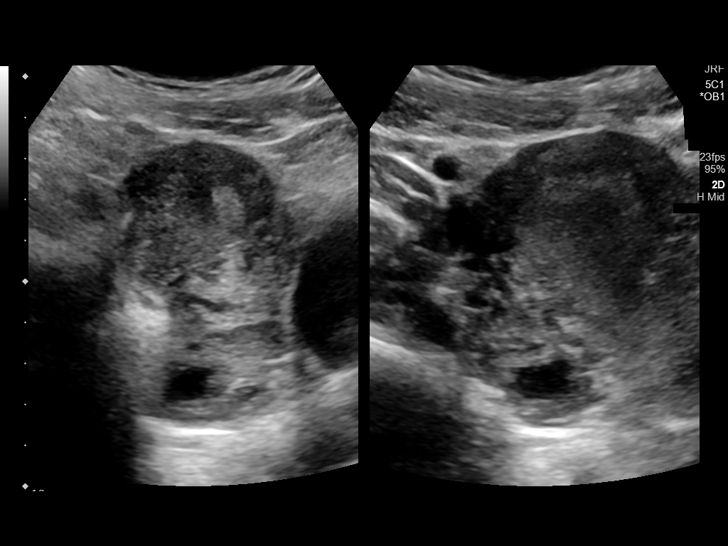
[im 68/78]
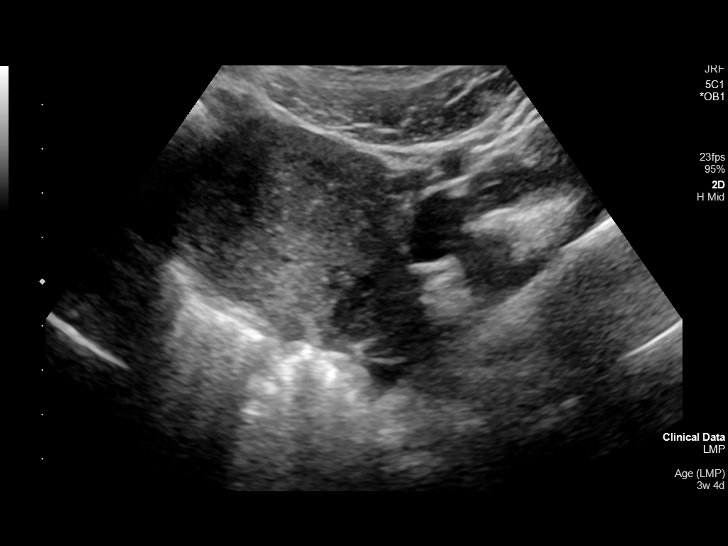
[im 78/78]
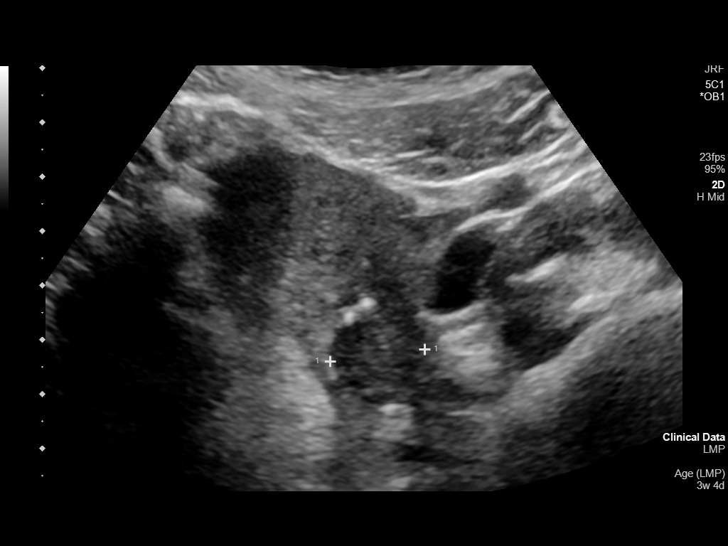

[Series 1001: us ob < 14 weeks - us ob tv · 5 of 47 slices shown (2 of 2)]
[im 5/47]
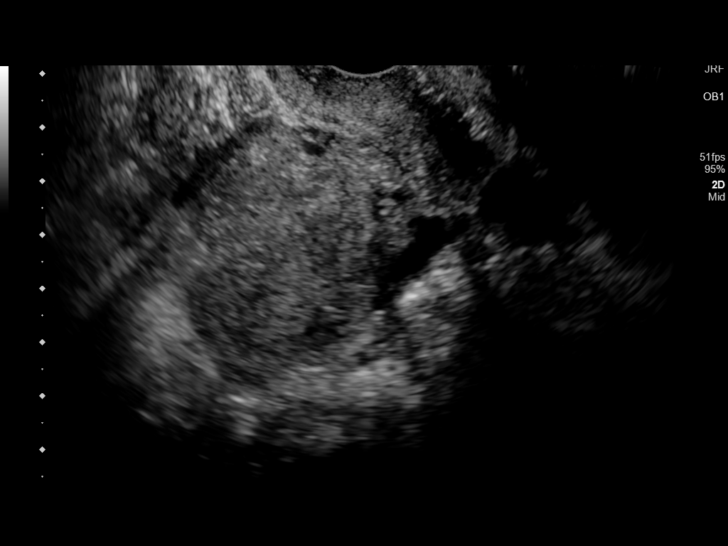
[im 14/47]
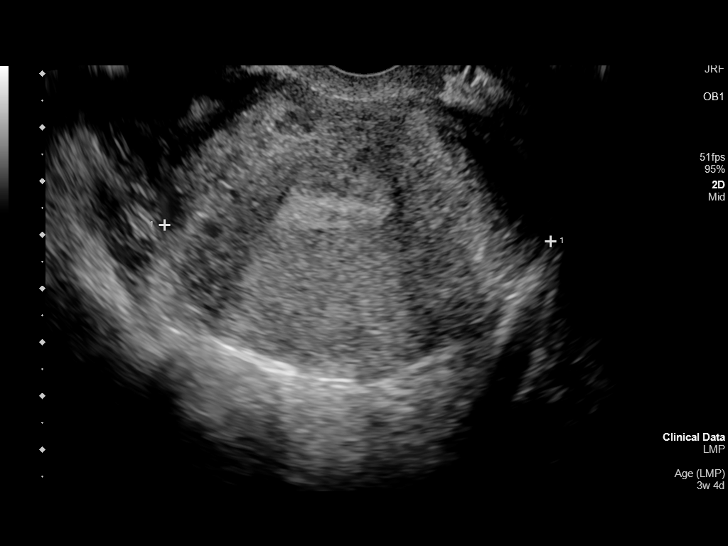
[im 24/47]
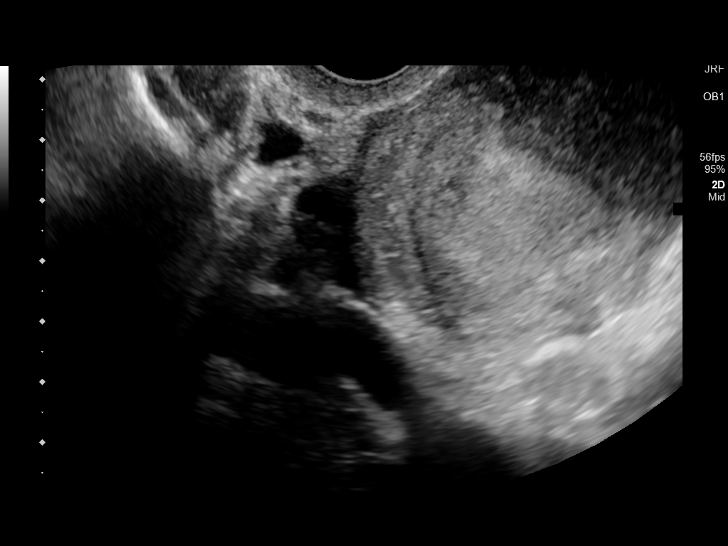
[im 33/47]
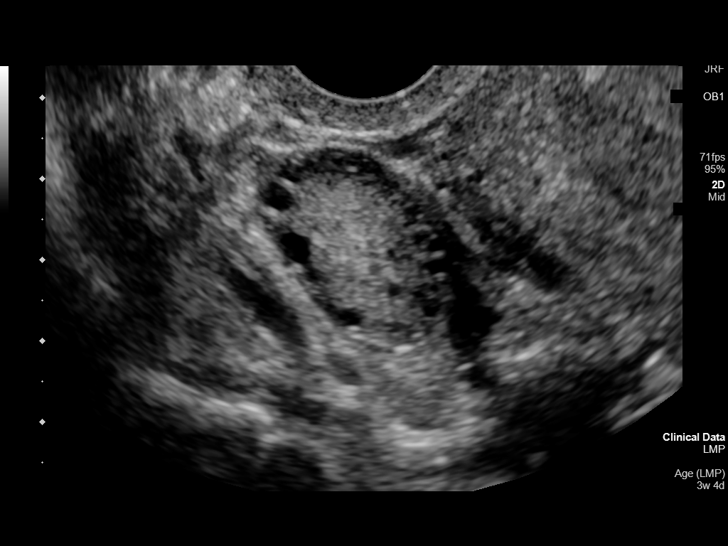
[im 42/47]
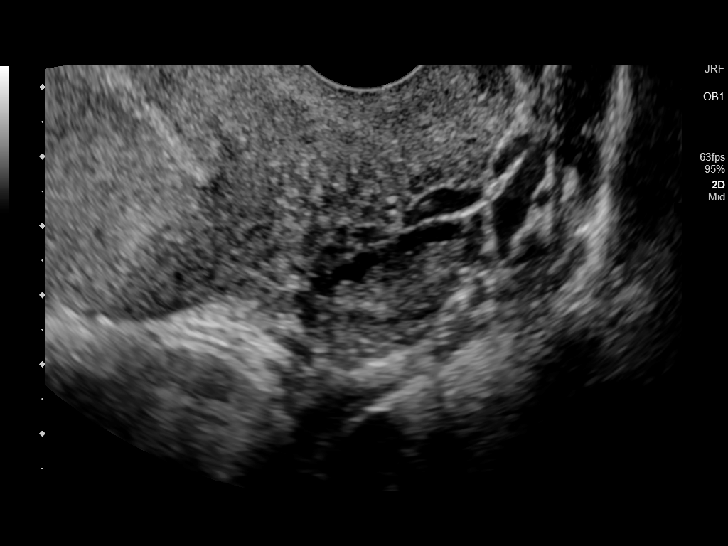

[13 of 28 positions shown; findings below may reference images not displayed]

FINDINGS: Intrauterine gestational sac: None

Maternal uterus/adnexae: Endometrial thickening, up to 15
millimeters. Bland appearance of the endometrium.

Trace or small volume simple appearing free fluid in the cul-de-sac
(series 2, image 12).

The right ovary measures 4.2 x 2.3 by 2.0 centimeters (10 millimeter
liter volume) with around thick-walled 24 millimeter area which
appears relatively hypovascular. See series 2, images 30 and 32).
The remaining right ovarian parenchyma appears normal.

Normal left ovary measuring 3.1 x 1.6 by 2.0 centimeters (volume 5
milliliters).
IMPRESSION: 1. No IUP identified.  Thickened but bland endometrium.
Right ovary 24 mm thick walled complex area which is indeterminate
for corpus luteum.
Normal left ovary.  Trace simple appearing pelvic free fluid.
2. Differential considerations include early IUP, failed IUP, and
ectopic pregnancy.
Recommend serial quantitative beta HCG and repeat ultrasound as
necessary.

## 2022-09-28 ENCOUNTER — Other Ambulatory Visit: Payer: Self-pay

## 2022-09-28 ENCOUNTER — Emergency Department
Admission: EM | Admit: 2022-09-28 | Discharge: 2022-09-28 | Disposition: A | Discharge status: Home / Self Care | Payer: Medicaid Other | Attending: Emergency Medicine | Admitting: Emergency Medicine

## 2022-09-28 DIAGNOSIS — N898 Other specified noninflammatory disorders of vagina: Secondary | ICD-10-CM | POA: Diagnosis present

## 2022-09-28 LAB — WET PREP, GENITAL
Clue Cells Wet Prep HPF POC: NONE SEEN
Sperm: NONE SEEN
Trich, Wet Prep: NONE SEEN
WBC, Wet Prep HPF POC: <10 (ref <10)
Yeast Wet Prep HPF POC: NONE SEEN

## 2022-09-28 LAB — CHLAMYDIA/NGC RT PCR (ARMC ONLY)
Chlamydia Tr: NOT DETECTED
N gonorrhoeae: NOT DETECTED

## 2022-09-28 NOTE — Discharge Instructions (Addendum)
Follow up with your regular doctor if not improving in 3 to 4 days Take a probiotic daily to help keep the vaginal flora balanced to prevent yeast or bacterial vaginosis

## 2022-09-28 NOTE — ED Provider Notes (Signed)
   [facility] Provider Note    Event Date/Time   First MD Initiated Contact with Patient 09/28/22 1149     (approximate)   History   Vaginal Itching   HPI  Jenna Harvey is a 32 y.o. female with no significant past medical history presents emergency department with vaginal pain/itching.  No discharge.  No fever or chills.  No abdominal pain.      Physical Exam   Triage Vital Signs: ED Triage Vitals [09/28/22 1151]  Enc Vitals Group     BP      Pulse      Resp      Temp      Temp src      SpO2      Weight 173 lb 15.1 oz (78.9 kg)     Height 5' (1.524 m)     Head Circumference      Peak Flow      Pain Score 0     Pain Loc      Pain Edu?      Excl. in [place]?     Most recent vital signs: There were no vitals filed for this visit.   General: Awake, no distress.   CV:  Good peripheral perfusion. regular rate and  rhythm Resp:  Normal effort.  Abd:  No distention.   Other:     ED Results / Procedures / Treatments   Labs (all labs ordered are listed, but only abnormal results are displayed) Labs Reviewed  WET PREP, GENITAL  CHLAMYDIA/NGC RT PCR ([facility] ONLY)               EKG     RADIOLOGY     PROCEDURES:   Procedures   MEDICATIONS ORDERED IN ED: Medications - No data to display   IMPRESSION / MDM / [place] / ED COURSE  I reviewed the triage vital signs and the nursing notes.                              Differential diagnosis includes, but is not limited to, yeast, bacterial vaginosis, STD  Patient's presentation is most consistent with acute complicated illness / injury requiring diagnostic workup.   Had the patient self swab in triage.  She appears to be very well and is here with her daughter who is also being seen.  Will call her back with results.  Wet prep is reassuring, no yeast or clue cells noted  Called pt to notify, will call her if gc/chlam are positive and she needs treatment, pt in  agreement with the treatment plan, was stable at discharge  Gc/chlam negative   FINAL CLINICAL IMPRESSION(S) / ED DIAGNOSES   Final diagnoses:  Vaginal itching     Rx / DC Orders   ED Discharge Orders     None        Note:  This document was prepared using Dragon voice recognition software and may include unintentional dictation errors.    Versie Starks, PA-C 09/28/22 1416    Naaman Plummer, MD 09/28/22 [phone]

## 2022-09-28 NOTE — ED Triage Notes (Signed)
Pt here with vaginal itching and wants to be checked. Pt calm and cooperative in triage with infant child.

## 2023-07-03 ENCOUNTER — Emergency Department
Admission: EM | Admit: 2023-07-03 | Discharge: 2023-07-03 | Disposition: A | Discharge status: Home / Self Care | Payer: Medicaid Other | Attending: Emergency Medicine | Admitting: Emergency Medicine

## 2023-07-03 ENCOUNTER — Other Ambulatory Visit: Payer: Self-pay

## 2023-07-03 DIAGNOSIS — M545 Low back pain, unspecified: Secondary | ICD-10-CM | POA: Diagnosis not present

## 2023-07-03 DIAGNOSIS — R300 Dysuria: Secondary | ICD-10-CM | POA: Diagnosis present

## 2023-07-03 LAB — URINALYSIS, ROUTINE W REFLEX MICROSCOPIC
Bilirubin Urine: NEGATIVE
Glucose, UA: NEGATIVE mg/dL
Hgb urine dipstick: NEGATIVE
Ketones, ur: NEGATIVE mg/dL
Nitrite: NEGATIVE
Protein, ur: NEGATIVE mg/dL
Specific Gravity, Urine: 1.029 (ref 1.005–1.030)
WBC, UA: >50 WBC/hpf (ref 0–5)
pH: 6 (ref 5.0–8.0)

## 2023-07-03 LAB — CHLAMYDIA/NGC RT PCR (ARMC ONLY)
Chlamydia Tr: NOT DETECTED
N gonorrhoeae: NOT DETECTED

## 2023-07-03 LAB — POC URINE PREG, ED: Preg Test, Ur: NEGATIVE

## 2023-07-03 LAB — WET PREP, GENITAL
Clue Cells Wet Prep HPF POC: NONE SEEN
Sperm: NONE SEEN
Trich, Wet Prep: NONE SEEN
WBC, Wet Prep HPF POC: <10 (ref <10)
Yeast Wet Prep HPF POC: NONE SEEN

## 2023-07-03 NOTE — ED Notes (Signed)
See triage notes. Patient c/o back pain times two days. Patient reports pain and burning with urination.

## 2023-07-03 NOTE — ED Triage Notes (Signed)
Pt comes with c/o back pain for 2 days. Pt states little urination when she goes to bathroom.  Pt states pain and burning.

## 2023-07-03 NOTE — ED Provider Notes (Signed)
[facility] Provider Note    Event Date/Time   First MD Initiated Contact with Patient 07/03/23 1752     (approximate)   History   UTI   HPI  Jenna Harvey is a 32 y.o. female with no significant past medical history presents emergency department with dysuria.  Patient states that she came off of Depo in February and has had difficulty with vaginal dryness.  Now is having some burning with urination.  Patient states that she also has some low back pain.  No fever or chills.  No concerns for STD.      Physical Exam   Triage Vital Signs: ED Triage Vitals  Encounter Vitals Group     BP 07/03/23 1617 (!) 140/89     Systolic BP Percentile --      Diastolic BP Percentile --      Pulse Rate 07/03/23 1617 98     Resp 07/03/23 1617 20     Temp 07/03/23 1617 98.2 F (36.8 C)     Temp Source 07/03/23 1617 Oral     SpO2 07/03/23 1617 100 %     Weight --      Height --      Head Circumference --      Peak Flow --      Pain Score 07/03/23 1614 5     Pain Loc --      Pain Education --      Exclude from Growth Chart --     Most recent vital signs: Vitals:   07/03/23 1617  BP: (!) 140/89  Pulse: 98  Resp: 20  Temp: 98.2 F (36.8 C)  SpO2: 100%     General: Awake, no distress.   CV:  Good peripheral perfusion. regular rate and  rhythm Resp:  Normal effort. Abd:  No distention.   Other:  Pelvic exam is benign, no discharge, cervix appears normal, no external lesions   ED Results / Procedures / Treatments   Labs (all labs ordered are listed, but only abnormal results are displayed) Labs Reviewed  URINALYSIS, ROUTINE W REFLEX MICROSCOPIC - Abnormal; Notable for the following components:      Result Value   Color, Urine YELLOW (*)    APPearance HAZY (*)    Leukocytes,Ua SMALL (*)    Bacteria, UA RARE (*)    All other components within normal limits  WET PREP, GENITAL  CHLAMYDIA/NGC RT PCR ([facility] ONLY)            URINE CULTURE  POC  URINE PREG, ED     EKG     RADIOLOGY     PROCEDURES:   Procedures   MEDICATIONS ORDERED IN ED: Medications - No data to display   IMPRESSION / MDM / ASSESSMENT AND PLAN / ED COURSE  I reviewed the triage vital signs and the nursing notes.                              Differential diagnosis includes, but is not limited to, vaginal dryness, UTI, dysuria, candidiasis, bacterial vaginosis, STD  Patient's presentation is most consistent with acute illness / injury with system symptoms.   POC pregnancy negative, urinalysis has a small amount of leuks with rare bacteria with greater than 50 WBCs.  I have concerns that there is a lot more bacteria in the urine so we will do a wet prep/GC/chlamydia.  Wet prep is reassuring, GC/chlamydia  pending.  I did explain findings to patient.  I do agree with her this may be due to her Depo in her vaginal area not having returned to its normal flora.  She is to try over-the-counter replenish.  Did run a urine culture just in case it is a UTI that is not obvious.  She is in agreement treatment plan.  Discharged stable condition.  Strict instructions to return if fever.  Follow-up with the health department if GC/chlamydia is positive     FINAL CLINICAL IMPRESSION(S) / ED DIAGNOSES   Final diagnoses:  Dysuria     Rx / DC Orders   ED Discharge Orders     None        Note:  This document was prepared using Dragon voice recognition software and may include unintentional dictation errors.    Faythe Ghee, PA-C 07/03/23 Carlis Stable    Dionne Bucy, MD 07/04/23 [phone]

## 2023-07-06 LAB — URINE CULTURE: Culture: 40000 — AB

## 2024-01-09 ENCOUNTER — Telehealth: Admitting: Family Medicine

## 2024-01-09 DIAGNOSIS — K0889 Other specified disorders of teeth and supporting structures: Secondary | ICD-10-CM

## 2024-01-09 NOTE — Progress Notes (Signed)
  Because the severe level of pain, with broken tooth and swelling, and trouble swallowing and talking, your condition warrants further evaluation and I recommend that you be seen in a face-to-face visit at a local urgent care office.   NOTE: There will be NO CHARGE for this E-Visit   If you are having a true medical emergency, please call 911.

## 2024-03-11 NOTE — Progress Notes (Deleted)
 New patient visit  Patient: Jenna Harvey   DOB: 22-Dec-1990   33 y.o. Female  MRN: [id number] Visit Date: 03/12/2024  Today's healthcare provider: Blane Bunting, PA-C   No chief complaint on file.  Subjective    Jenna Harvey is a 33 y.o. female who presents today as a new patient to establish care.  HPI  *** Discussed the use of AI scribe software for clinical note transcription with the patient, who gave verbal consent to proceed.  History of Present Illness     No past medical history on file. Past Surgical History:  Procedure Laterality Date   CESAREAN SECTION     LEEP     unsure of the date, after 2010   No family status information on file.   No family history on file. Social History   Socioeconomic History   Marital status: Single    Spouse name: Not on file   Number of children: Not on file   Years of education: Not on file   Highest education level: Not on file  Occupational History   Not on file  Tobacco Use   Smoking status: Never   Smokeless tobacco: Never  Substance and Sexual Activity   Alcohol use: Not Currently   Drug use: Not Currently    Types: Marijuana   Sexual activity: Not on file  Other Topics Concern   Not on file  Social History Narrative   Not on file   Social Drivers of Health   Financial Resource Strain: Not on file  Food Insecurity: No Food Insecurity (12/24/2019)   Received from [facility]   Hunger Vital Sign    Within the past 12 months, you worried that your food would run out before you got the money to buy more.: Never true    Within the past 12 months, the food you bought just didn't last and you didn't have money to get more.: Never true  Transportation Needs: No Transportation Needs (04/28/2021)   Received from [facility] - Transportation    In the past 12 months, has lack of transportation kept you from medical appointments or from getting medications?: No    Lack of  Transportation (Non-Medical): No  Physical Activity: Not on file  Stress: Not on file  Social Connections: Not on file   No outpatient medications prior to visit.   No facility-administered medications prior to visit.   Allergies  Allergen Reactions   Bergera Koenigii ([place]) Danniel Duverney Koenigii]    Penicillins Hives   Tomato Swelling    Immunization History  Administered Date(s) Administered   Moderna Sars-Covid-2 Vaccination 06/12/2020, 07/13/2020   PFIZER Comirnaty([place])Covid-19 Tri-Sucrose Vaccine 12/24/2020    Health Maintenance  Topic Date Due   HPV VACCINES (1 - 3-dose series) Never done   HIV Screening  Never done   Hepatitis C Screening  Never done   DTaP/Tdap/Td (1 - Tdap) Never done   Cervical Cancer Screening (HPV/Pap Cotest)  Never done   COVID-19 Vaccine (4 - 2024-25 season) 05/28/2023   INFLUENZA VACCINE  04/26/2024   Meningococcal B Vaccine  Aged Out    Patient Care Team: Services, [facility] as PCP - General  Review of Systems Except see HPI   {Insert previous labs (optional):23779} {See past labs  Heme  Chem  Endocrine  Serology  Results Review (optional):1}   Objective    There were no vitals taken for this visit. {Insert last BP/Wt (optional):23777}{See vitals history (  optional):1}   Physical Exam  Depression Screen     No data to display         No results found for any visits on 03/12/24.  Assessment & Plan     *** Assessment and Plan Assessment & Plan      Encounter to establish care Welcomed to our clinic Reviewed past medical hx, social hx, family hx and surgical hx Pt advised to send all vaccination records or screening   No follow-ups on file.    The patient was advised to call back or seek an in-person evaluation if the symptoms worsen or if the condition fails to improve as anticipated.  I discussed the assessment and treatment plan with the patient. The patient was provided an opportunity to  ask questions and all were answered. The patient agreed with the plan and demonstrated an understanding of the instructions.  I, Jerod Mcquain, PA-C have reviewed all documentation for this visit. The documentation on  03/12/2024   for the exam, diagnosis, procedures, and orders are all accurate and complete.  Blane Bunting, [facility], [facility] [phone] (phone) [phone] (fax)  [facility]

## 2024-03-12 ENCOUNTER — Ambulatory Visit: Admitting: Physician Assistant

## 2024-03-12 DIAGNOSIS — Z7689 Persons encountering health services in other specified circumstances: Secondary | ICD-10-CM
